# Patient Record
Sex: Female | Born: 1955 | Race: White | Hispanic: No | Smoking: Current every day smoker
Health system: Southern US, Community
[De-identification: ages and names within clinical notes are randomized; demographics above are authoritative.]

## PROBLEM LIST (undated history)

## (undated) DIAGNOSIS — B1920 Unspecified viral hepatitis C without hepatic coma: Secondary | ICD-10-CM

## (undated) DIAGNOSIS — K219 Gastro-esophageal reflux disease without esophagitis: Secondary | ICD-10-CM

## (undated) DIAGNOSIS — R768 Other specified abnormal immunological findings in serum: Secondary | ICD-10-CM

## (undated) DIAGNOSIS — Z972 Presence of dental prosthetic device (complete) (partial): Secondary | ICD-10-CM

## (undated) DIAGNOSIS — F419 Anxiety disorder, unspecified: Secondary | ICD-10-CM

## (undated) DIAGNOSIS — Z8489 Family history of other specified conditions: Secondary | ICD-10-CM

## (undated) DIAGNOSIS — Z973 Presence of spectacles and contact lenses: Secondary | ICD-10-CM

## (undated) DIAGNOSIS — J302 Other seasonal allergic rhinitis: Secondary | ICD-10-CM

## (undated) DIAGNOSIS — K08109 Complete loss of teeth, unspecified cause, unspecified class: Secondary | ICD-10-CM

## (undated) DIAGNOSIS — M1990 Unspecified osteoarthritis, unspecified site: Secondary | ICD-10-CM

## (undated) DIAGNOSIS — G709 Myoneural disorder, unspecified: Secondary | ICD-10-CM

## (undated) DIAGNOSIS — J410 Simple chronic bronchitis: Secondary | ICD-10-CM

## (undated) DIAGNOSIS — Z720 Tobacco use: Secondary | ICD-10-CM

## (undated) DIAGNOSIS — J449 Chronic obstructive pulmonary disease, unspecified: Secondary | ICD-10-CM

## (undated) DIAGNOSIS — N903 Dysplasia of vulva, unspecified: Secondary | ICD-10-CM

## (undated) DIAGNOSIS — K7460 Unspecified cirrhosis of liver: Secondary | ICD-10-CM

## (undated) HISTORY — DX: Tobacco use: Z72.0

## (undated) HISTORY — PX: TONSILLECTOMY: SUR1361

## (undated) HISTORY — DX: Myoneural disorder, unspecified: G70.9

## (undated) HISTORY — DX: Unspecified viral hepatitis C without hepatic coma: B19.20

## (undated) HISTORY — PX: SKIN GRAFT FULL THICKNESS LEG: SUR1299

## (undated) HISTORY — DX: Gastro-esophageal reflux disease without esophagitis: K21.9

## (undated) HISTORY — DX: Unspecified cirrhosis of liver: K74.60

## (undated) HISTORY — PX: UMBILICAL HERNIA REPAIR: SHX196

## (undated) HISTORY — DX: Anxiety disorder, unspecified: F41.9

## (undated) HISTORY — DX: Dysplasia of vulva, unspecified: N90.3

---

## 2011-12-03 ENCOUNTER — Emergency Department: Payer: Self-pay | Admitting: Emergency Medicine

## 2011-12-03 LAB — COMPREHENSIVE METABOLIC PANEL
Albumin: 3.6 g/dL (ref 3.4–5.0)
Anion Gap: 9 (ref 7–16)
Calcium, Total: 9.2 mg/dL (ref 8.5–10.1)
Chloride: 105 mmol/L (ref 98–107)
Co2: 26 mmol/L (ref 21–32)
EGFR (African American): >60
Glucose: 137 mg/dL — ABNORMAL HIGH (ref 65–99)
Osmolality: 281 (ref 275–301)
Potassium: 3.8 mmol/L (ref 3.5–5.1)
SGOT(AST): 37 U/L (ref 15–37)
Sodium: 140 mmol/L (ref 136–145)

## 2011-12-03 LAB — CBC
HCT: 46.3 % (ref 35.0–47.0)
MCHC: 34.5 g/dL (ref 32.0–36.0)
MCV: 88 fL (ref 80–100)
RDW: 12.5 % (ref 11.5–14.5)

## 2012-06-02 ENCOUNTER — Ambulatory Visit: Payer: Self-pay | Admitting: Family Medicine

## 2012-06-02 ENCOUNTER — Ambulatory Visit: Payer: Self-pay

## 2012-06-02 VITALS — BP 102/60 | HR 96 | Temp 100.4°F | Resp 16 | Ht 67.0 in | Wt 144.6 lb

## 2012-06-02 DIAGNOSIS — R059 Cough, unspecified: Secondary | ICD-10-CM

## 2012-06-02 DIAGNOSIS — R51 Headache: Secondary | ICD-10-CM

## 2012-06-02 DIAGNOSIS — R6883 Chills (without fever): Secondary | ICD-10-CM

## 2012-06-02 DIAGNOSIS — R05 Cough: Secondary | ICD-10-CM

## 2012-06-02 DIAGNOSIS — R112 Nausea with vomiting, unspecified: Secondary | ICD-10-CM

## 2012-06-02 DIAGNOSIS — R509 Fever, unspecified: Secondary | ICD-10-CM

## 2012-06-02 LAB — COMPREHENSIVE METABOLIC PANEL
ALT: 33 U/L (ref 0–35)
AST: 36 U/L (ref 0–37)
Albumin: 4 g/dL (ref 3.5–5.2)
Alkaline Phosphatase: 94 U/L (ref 39–117)
BUN: 15 mg/dL (ref 6–23)
CO2: 26 mEq/L (ref 19–32)
Calcium: 9.3 mg/dL (ref 8.4–10.5)
Chloride: 100 mEq/L (ref 96–112)
Creat: 1.02 mg/dL (ref 0.50–1.10)
Glucose, Bld: 100 mg/dL — ABNORMAL HIGH (ref 70–99)
Potassium: 3.8 mEq/L (ref 3.5–5.3)
Sodium: 136 mEq/L (ref 135–145)
Total Bilirubin: 0.9 mg/dL (ref 0.3–1.2)
Total Protein: 7.8 g/dL (ref 6.0–8.3)

## 2012-06-02 LAB — POCT CBC
Granulocyte percent: 57.4 %G (ref 37–80)
HCT, POC: 45.8 % (ref 37.7–47.9)
Hemoglobin: 14.9 g/dL (ref 12.2–16.2)
Lymph, poc: 2 (ref 0.6–3.4)
MCH, POC: 29.5 pg (ref 27–31.2)
MCHC: 32.5 g/dL (ref 31.8–35.4)
MCV: 90.6 fL (ref 80–97)
MID (cbc): 0.4 (ref 0–0.9)
MPV: 9.3 fL (ref 0–99.8)
POC Granulocyte: 3.2 (ref 2–6.9)
POC LYMPH PERCENT: 35.3 %L (ref 10–50)
POC MID %: 7.3 %M (ref 0–12)
Platelet Count, POC: 148 10*3/uL (ref 142–424)
RBC: 5.05 M/uL (ref 4.04–5.48)
RDW, POC: 12.8 %
WBC: 5.6 10*3/uL (ref 4.6–10.2)

## 2012-06-02 MED ORDER — PHENYLEPH-PROMETHAZINE-COD 5-6.25-10 MG/5ML PO SYRP: 5.0000 mL | ORAL_SOLUTION | Freq: Four times a day (QID) | ORAL | Status: DC | PRN

## 2012-06-02 MED ORDER — PREDNISONE 20 MG PO TABS: ORAL_TABLET | ORAL | Status: DC

## 2012-06-02 MED ORDER — LEVOFLOXACIN 500 MG PO TABS: 500.0000 mg | ORAL_TABLET | Freq: Every day | ORAL | Status: DC

## 2012-06-02 NOTE — Patient Instructions (Signed)

## 2012-06-02 NOTE — Progress Notes (Signed)
57 year old woman, smoker, retired, who presents with severe cough, nausea and vomiting, sore throat, headache, myalgias for the last week. Cough is productive  Objective: Patient looks Haggard but alert with good eye contact and pleasant demeanor HEENT: Unremarkable with exception of a reddened throat. She's got dentures Neck: Supple no adenopathy Chest: Bilateral respiratory rales without dyspnea Heart: Regular no murmur Skin: Warm and dry [facility] reading (PRIMARY) by  Dr. Milus Glazier  CXR peribronchial thickening and suggestion of early infiltrate in left hilum, flattened diaphragm Results for orders placed in visit on 06/02/12  POCT CBC      Result Value Range   WBC 5.6  4.6 - 10.2 K/uL   Lymph, poc 2.0  0.6 - 3.4   POC LYMPH PERCENT 35.3  10 - 50 %L   MID (cbc) 0.4  0 - 0.9   POC MID % 7.3  0 - 12 %M   POC Granulocyte 3.2  2 - 6.9   Granulocyte percent 57.4  37 - 80 %G   RBC 5.05  4.04 - 5.48 M/uL   Hemoglobin 14.9  12.2 - 16.2 g/dL   HCT, POC 16.1  09.6 - 47.9 %   MCV 90.6  80 - 97 fL   MCH, POC 29.5  27 - 31.2 pg   MCHC 32.5  31.8 - 35.4 g/dL   RDW, POC 04.5     Platelet Count, POC 148  142 - 424 K/uL   MPV 9.3  0 - 99.8 fL    Assessment:  Early pneumonia, COPD  Plan:Cough - Plan: DG Chest 2 View, POCT CBC, Comprehensive metabolic panel, levofloxacin (LEVAQUIN) 500 MG tablet, Phenyleph-Promethazine-Cod 5-6.25-10 MG/5ML SYRP, predniSONE (DELTASONE) 20 MG tablet  Headache - Plan: POCT CBC, levofloxacin (LEVAQUIN) 500 MG tablet  Chills - Plan: POCT CBC, levofloxacin (LEVAQUIN) 500 MG tablet  Fever - Plan: POCT CBC, levofloxacin (LEVAQUIN) 500 MG tablet  Nausea with vomiting - Plan: levofloxacin (LEVAQUIN) 500 MG tablet   .

## 2016-10-27 DIAGNOSIS — T1490XA Injury, unspecified, initial encounter: Secondary | ICD-10-CM | POA: Insufficient documentation

## 2016-10-28 DIAGNOSIS — S62111A Displaced fracture of triquetrum [cuneiform] bone, right wrist, initial encounter for closed fracture: Secondary | ICD-10-CM | POA: Insufficient documentation

## 2016-10-28 DIAGNOSIS — S92309A Fracture of unspecified metatarsal bone(s), unspecified foot, initial encounter for closed fracture: Secondary | ICD-10-CM | POA: Insufficient documentation

## 2016-10-28 DIAGNOSIS — S5291XA Unspecified fracture of right forearm, initial encounter for closed fracture: Secondary | ICD-10-CM | POA: Insufficient documentation

## 2016-10-28 DIAGNOSIS — S92251A Displaced fracture of navicular [scaphoid] of right foot, initial encounter for closed fracture: Secondary | ICD-10-CM | POA: Insufficient documentation

## 2017-01-16 DIAGNOSIS — Z945 Skin transplant status: Secondary | ICD-10-CM | POA: Insufficient documentation

## 2017-01-16 DIAGNOSIS — M79671 Pain in right foot: Secondary | ICD-10-CM | POA: Insufficient documentation
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
## 2020-04-05 ENCOUNTER — Other Ambulatory Visit: Payer: Self-pay

## 2020-04-05 ENCOUNTER — Ambulatory Visit (INDEPENDENT_AMBULATORY_CARE_PROVIDER_SITE_OTHER): Payer: 59 | Admitting: Family

## 2020-04-05 VITALS — BP 136/83 | HR 72 | Ht 66.3 in | Wt 143.2 lb

## 2020-04-05 DIAGNOSIS — N952 Postmenopausal atrophic vaginitis: Secondary | ICD-10-CM | POA: Insufficient documentation

## 2020-04-05 DIAGNOSIS — Z124 Encounter for screening for malignant neoplasm of cervix: Secondary | ICD-10-CM

## 2020-04-05 DIAGNOSIS — Z7689 Persons encountering health services in other specified circumstances: Secondary | ICD-10-CM | POA: Diagnosis not present

## 2020-04-05 DIAGNOSIS — Z716 Tobacco abuse counseling: Secondary | ICD-10-CM | POA: Diagnosis not present

## 2020-04-05 DIAGNOSIS — B009 Herpesviral infection, unspecified: Secondary | ICD-10-CM | POA: Insufficient documentation

## 2020-04-05 DIAGNOSIS — Z122 Encounter for screening for malignant neoplasm of respiratory organs: Secondary | ICD-10-CM | POA: Diagnosis not present

## 2020-04-05 DIAGNOSIS — Z23 Encounter for immunization: Secondary | ICD-10-CM

## 2020-04-05 DIAGNOSIS — N9089 Other specified noninflammatory disorders of vulva and perineum: Secondary | ICD-10-CM | POA: Insufficient documentation

## 2020-04-05 DIAGNOSIS — Z1231 Encounter for screening mammogram for malignant neoplasm of breast: Secondary | ICD-10-CM

## 2020-04-05 MED ORDER — VARENICLINE TARTRATE 0.5 MG PO TABS: ORAL_TABLET | ORAL | 0 refills | Status: DC

## 2020-04-05 NOTE — Patient Instructions (Addendum)
Return in 4 to 6 weeks or sooner if needed for annual physical examination, labs, and health maintenance. Arrive fasting meaning having had no food and/or nothing to drink for at least 8 hours prior to appointment.  Chantix for smoking cessation.  Lung cancer screening.   Referral to Gynecology.   Shingles vaccine today. Thank you for choosing Primary Care at [facility] for your medical home!    Laura Sloan was seen by Camillia Herter, NP today.   Guinevere Scarlet primary care provider is Camillia Herter, NP.   For the best care possible,  you should try to see Durene Fruits, NP whenever you come to clinic.   We look forward to seeing you again soon!  If you have any questions about your visit today,  please call us at [phone]  Or feel free to reach your provider via [place].   Steps to Quit Smoking Smoking tobacco is the leading cause of preventable death. It can affect almost every organ in the body. Smoking puts you and people around you at risk for many serious, long-lasting (chronic) diseases. Quitting smoking can be hard, but it is one of the best things that you can do for your health. It is never too late to quit. How do I get ready to quit? When you decide to quit smoking, make a plan to help you succeed. Before you quit:  Pick a date to quit. Set a date within the next 2 weeks to give you time to prepare.  Write down the reasons why you are quitting. Keep this list in places where you will see it often.  Tell your family, friends, and co-workers that you are quitting. Their support is important.  Talk with your doctor about the choices that may help you quit.  Find out if your health insurance will pay for these treatments.  Know the people, places, things, and activities that make you want to smoke (triggers). Avoid them. What first steps can I take to quit smoking?  Throw away all cigarettes at home, at work, and in your car.  Throw away the things  that you use when you smoke, such as ashtrays and lighters.  Clean your car. Make sure to empty the ashtray.  Clean your home, including curtains and carpets. What can I do to help me quit smoking? Talk with your doctor about taking medicines and seeing a counselor at the same time. You are more likely to succeed when you do both.  If you are pregnant or breastfeeding, talk with your doctor about counseling or other ways to quit smoking. Do not take medicine to help you quit smoking unless your doctor tells you to do so. To quit smoking: Quit right away  Quit smoking totally, instead of slowly cutting back on how much you smoke over a period of time.  Go to counseling. You are more likely to quit if you go to counseling sessions regularly. Take medicine You may take medicines to help you quit. Some medicines need a prescription, and some you can buy over-the-counter. Some medicines may contain a drug called nicotine to replace the nicotine in cigarettes. Medicines may:  Help you to stop having the desire to smoke (cravings).  Help to stop the problems that come when you stop smoking (withdrawal symptoms). Your doctor may ask you to use:  Nicotine patches, gum, or lozenges.  Nicotine inhalers or sprays.  Non-nicotine medicine that is taken by mouth. Find resources Find resources and  other ways to help you quit smoking and remain smoke-free after you quit. These resources are most helpful when you use them often. They include:  Online chats with a Social worker.  Phone quitlines.  Printed Furniture conservator/restorer.  Support groups or group counseling.  Text messaging programs.  Mobile phone apps. Use apps on your mobile phone or tablet that can help you stick to your quit plan. There are many free apps for mobile phones and tablets as well as websites. Examples include Quit Guide from the [employer] and smokefree.gov   What things can I do to make it easier to quit?  Talk to your family and  friends. Ask them to support and encourage you.  Call a phone quitline (1-800-QUIT-NOW), reach out to support groups, or work with a Social worker.  Ask people who smoke to not smoke around you.  Avoid places that make you want to smoke, such as: ? Bars. ? Parties. ? Smoke-break areas at work.  Spend time with people who do not smoke.  Lower the stress in your life. Stress can make you want to smoke. Try these things to help your stress: ? Getting regular exercise. ? Doing deep-breathing exercises. ? Doing yoga. ? Meditating. ? Doing a body scan. To do this, close your eyes, focus on one area of your body at a time from head to toe. Notice which parts of your body are tense. Try to relax the muscles in those areas.   How will I feel when I quit smoking? Day 1 to 3 weeks Within the first 24 hours, you may start to have some problems that come from quitting tobacco. These problems are very bad 2-3 days after you quit, but they do not often last for more than 2-3 weeks. You may get these symptoms:  Mood swings.  Feeling restless, nervous, angry, or annoyed.  Trouble concentrating.  Dizziness.  Strong desire for high-sugar foods and nicotine.  Weight gain.  Trouble pooping (constipation).  Feeling like you may vomit (nausea).  Coughing or a sore throat.  Changes in how the medicines that you take for other issues work in your body.  Depression.  Trouble sleeping (insomnia). Week 3 and afterward After the first 2-3 weeks of quitting, you may start to notice more positive results, such as:  Better sense of smell and taste.  Less coughing and sore throat.  Slower heart rate.  Lower blood pressure.  Clearer skin.  Better breathing.  Fewer sick days. Quitting smoking can be hard. Do not give up if you fail the first time. Some people need to try a few times before they succeed. Do your best to stick to your quit plan, and talk with your doctor if you have any  questions or concerns. Summary  Smoking tobacco is the leading cause of preventable death. Quitting smoking can be hard, but it is one of the best things that you can do for your health.  When you decide to quit smoking, make a plan to help you succeed.  Quit smoking right away, not slowly over a period of time.  When you start quitting, seek help from your doctor, family, or friends. This information is not intended to replace advice given to you by your health care provider. Make sure you discuss any questions you have with your health care provider. Document Revised: 11/13/2018 Document Reviewed: 05/09/2018 Elsevier Patient Education  [employer].

## 2020-04-05 NOTE — Progress Notes (Signed)
Establish care Wants help to stop smoking Pt feels like mucous is just sitting in throat  Need GYN referral  Pt desires shingles vaccine

## 2020-04-07 ENCOUNTER — Encounter (INDEPENDENT_AMBULATORY_CARE_PROVIDER_SITE_OTHER): Payer: Self-pay

## 2020-04-19 ENCOUNTER — Encounter: Payer: Self-pay | Admitting: Family

## 2020-04-20 ENCOUNTER — Telehealth: Payer: Self-pay

## 2020-04-20 ENCOUNTER — Telehealth: Payer: Self-pay | Admitting: Family

## 2020-04-20 NOTE — Telephone Encounter (Signed)
Information is needed for CT appt: TAX ID, NPI, Fax Number Please advise and thank you

## 2020-04-20 NOTE — Telephone Encounter (Signed)
Att to notify pt of scheduled CT scan no answer phone just rings. Message has been sent to pt Mychart

## 2020-04-26 ENCOUNTER — Telehealth: Payer: Self-pay

## 2020-04-26 ENCOUNTER — Telehealth: Payer: Self-pay | Admitting: Family

## 2020-04-26 NOTE — Telephone Encounter (Signed)
Contacted the office on prior authorization for CT, please advise.

## 2020-04-26 NOTE — Telephone Encounter (Signed)
Pt calling to inform PCP about CT CHEST LUNG CANCER SCREENING being rescheduled to 05/04/20 due to Lack of Insurance authorization. Pt would like to know if there's anything she need to do regarding this. Please advise and thank you

## 2020-04-27 ENCOUNTER — Telehealth: Payer: Self-pay

## 2020-04-27 ENCOUNTER — Ambulatory Visit (HOSPITAL_COMMUNITY): Payer: 59

## 2020-04-27 NOTE — Telephone Encounter (Signed)
Spoke w/ pt advised that PA pending for CT procedure

## 2020-04-27 NOTE — Telephone Encounter (Signed)
PA request refaxed pending authorization

## 2020-05-01 ENCOUNTER — Other Ambulatory Visit: Payer: Self-pay | Admitting: Family

## 2020-05-01 ENCOUNTER — Encounter: Payer: Self-pay | Admitting: Family

## 2020-05-01 DIAGNOSIS — Z716 Tobacco abuse counseling: Secondary | ICD-10-CM

## 2020-05-02 DIAGNOSIS — B182 Chronic viral hepatitis C: Secondary | ICD-10-CM

## 2020-05-02 DIAGNOSIS — K7460 Unspecified cirrhosis of liver: Secondary | ICD-10-CM

## 2020-05-02 HISTORY — DX: Unspecified cirrhosis of liver: K74.60

## 2020-05-02 HISTORY — DX: Chronic viral hepatitis C: B18.2

## 2020-05-03 ENCOUNTER — Ambulatory Visit (INDEPENDENT_AMBULATORY_CARE_PROVIDER_SITE_OTHER): Payer: 59 | Admitting: Family

## 2020-05-03 ENCOUNTER — Other Ambulatory Visit: Payer: Self-pay

## 2020-05-03 ENCOUNTER — Encounter: Payer: Self-pay | Admitting: Family

## 2020-05-03 VITALS — BP 128/85 | HR 61 | Ht 66.3 in | Wt 143.6 lb

## 2020-05-03 DIAGNOSIS — B1920 Unspecified viral hepatitis C without hepatic coma: Secondary | ICD-10-CM

## 2020-05-03 DIAGNOSIS — D539 Nutritional anemia, unspecified: Secondary | ICD-10-CM

## 2020-05-03 DIAGNOSIS — Z131 Encounter for screening for diabetes mellitus: Secondary | ICD-10-CM

## 2020-05-03 DIAGNOSIS — Z1159 Encounter for screening for other viral diseases: Secondary | ICD-10-CM

## 2020-05-03 DIAGNOSIS — Z114 Encounter for screening for human immunodeficiency virus [HIV]: Secondary | ICD-10-CM

## 2020-05-03 DIAGNOSIS — Z1211 Encounter for screening for malignant neoplasm of colon: Secondary | ICD-10-CM

## 2020-05-03 DIAGNOSIS — Z716 Tobacco abuse counseling: Secondary | ICD-10-CM

## 2020-05-03 DIAGNOSIS — Z130 Encounter for screening for diseases of the blood and blood-forming organs and certain disorders involving the immune mechanism: Secondary | ICD-10-CM

## 2020-05-03 DIAGNOSIS — K5900 Constipation, unspecified: Secondary | ICD-10-CM

## 2020-05-03 DIAGNOSIS — Z1329 Encounter for screening for other suspected endocrine disorder: Secondary | ICD-10-CM

## 2020-05-03 DIAGNOSIS — Z0000 Encounter for general adult medical examination without abnormal findings: Secondary | ICD-10-CM | POA: Diagnosis not present

## 2020-05-03 DIAGNOSIS — Z13220 Encounter for screening for lipoid disorders: Secondary | ICD-10-CM

## 2020-05-03 DIAGNOSIS — Z13228 Encounter for screening for other metabolic disorders: Secondary | ICD-10-CM

## 2020-05-03 DIAGNOSIS — G629 Polyneuropathy, unspecified: Secondary | ICD-10-CM

## 2020-05-03 DIAGNOSIS — K219 Gastro-esophageal reflux disease without esophagitis: Secondary | ICD-10-CM

## 2020-05-03 MED ORDER — OMEPRAZOLE 20 MG PO CPDR: 20.0000 mg | DELAYED_RELEASE_CAPSULE | Freq: Every day | ORAL | 0 refills | Status: DC

## 2020-05-03 MED ORDER — POLYETHYLENE GLYCOL 3350 17 G PO PACK: 17.0000 g | PACK | Freq: Every day | ORAL | 0 refills | Status: DC

## 2020-05-03 NOTE — Patient Instructions (Addendum)
Annual physical exam and labs today. Will call with results.   Miralax for constipation.  Omeprazole for acid reflux.   Referral to Neurology.   Follow-up with primary provider as needed. Preventive Care 31-63 Years Old, Female Preventive care refers to lifestyle choices and visits with your health care provider that can promote health and wellness. This includes:  A yearly physical exam. This is also called an annual wellness visit.  Regular dental and eye exams.  Immunizations.  Screening for certain conditions.  Healthy lifestyle choices, such as: ? Eating a healthy diet. ? Getting regular exercise. ? Not using drugs or products that contain nicotine and tobacco. ? Limiting alcohol use. What can I expect for my preventive care visit? Physical exam Your health care provider will check your:  Height and weight. These may be used to calculate your BMI (body mass index). BMI is a measurement that tells if you are at a healthy weight.  Heart rate and blood pressure.  Body temperature.  Skin for abnormal spots. Counseling Your health care provider may ask you questions about your:  Past medical problems.  Family's medical history.  Alcohol, tobacco, and drug use.  Emotional well-being.  Home life and relationship well-being.  Sexual activity.  Diet, exercise, and sleep habits.  Work and work Statistician.  Access to firearms.  Method of birth control.  Menstrual cycle.  Pregnancy history. What immunizations do I need? Vaccines are usually given at various ages, according to a schedule. Your health care provider will recommend vaccines for you based on your age, medical history, and lifestyle or other factors, such as travel or where you work.   What tests do I need? Blood tests  Lipid and cholesterol levels. These may be checked every 5 years, or more often if you are over 14 years old.  Hepatitis C test.  Hepatitis B test. Screening  Lung  cancer screening. You may have this screening every year starting at age 55 if you have a 30-pack-year history of smoking and currently smoke or have quit within the past 15 years.  Colorectal cancer screening. ? All adults should have this screening starting at age 50 and continuing until age 38. ? Your health care provider may recommend screening at age 67 if you are at increased risk. ? You will have tests every 1-10 years, depending on your results and the type of screening test.  Diabetes screening. ? This is done by checking your blood sugar (glucose) after you have not eaten for a while (fasting). ? You may have this done every 1-3 years.  Mammogram. ? This may be done every 1-2 years. ? Talk with your health care provider about when you should start having regular mammograms. This may depend on whether you have a family history of breast cancer.  BRCA-related cancer screening. This may be done if you have a family history of breast, ovarian, tubal, or peritoneal cancers.  Pelvic exam and Pap test. ? This may be done every 3 years starting at age 44. ? Starting at age 60, this may be done every 5 years if you have a Pap test in combination with an HPV test. Other tests  STD (sexually transmitted disease) testing, if you are at risk.  Bone density scan. This is done to screen for osteoporosis. You may have this scan if you are at high risk for osteoporosis. Talk with your health care provider about your test results, treatment options, and if necessary, the need for  more tests. Follow these instructions at home: Eating and drinking  Eat a diet that includes fresh fruits and vegetables, whole grains, lean protein, and low-fat dairy products.  Take vitamin and mineral supplements as recommended by your health care provider.  Do not drink alcohol if: ? Your health care provider tells you not to drink. ? You are pregnant, may be pregnant, or are planning to become pregnant.  If  you drink alcohol: ? Limit how much you have to 0-1 drink a day. ? Be aware of how much alcohol is in your drink. In the [place] one drink equals one 12 oz bottle of beer (355 mL), one 5 oz glass of wine (148 mL), or one 1 oz glass of hard liquor (44 mL).   Lifestyle  Take daily care of your teeth and gums. Brush your teeth every morning and night with fluoride toothpaste. Floss one time each day.  Stay active. Exercise for at least 30 minutes 5 or more days each week.  Do not use any products that contain nicotine or tobacco, such as cigarettes, e-cigarettes, and chewing tobacco. If you need help quitting, ask your health care provider.  Do not use drugs.  If you are sexually active, practice safe sex. Use a condom or other form of protection to prevent STIs (sexually transmitted infections).  If you do not wish to become pregnant, use a form of birth control. If you plan to become pregnant, see your health care provider for a prepregnancy visit.  If told by your health care provider, take low-dose aspirin daily starting at age 24.  Find healthy ways to cope with stress, such as: ? Meditation, yoga, or listening to music. ? Journaling. ? Talking to a trusted person. ? Spending time with friends and family. Safety  Always wear your seat belt while driving or riding in a vehicle.  Do not drive: ? If you have been drinking alcohol. Do not ride with someone who has been drinking. ? When you are tired or distracted. ? While texting.  Wear a helmet and other protective equipment during sports activities.  If you have firearms in your house, make sure you follow all gun safety procedures. What's next?  Visit your health care provider once a year for an annual wellness visit.  Ask your health care provider how often you should have your eyes and teeth checked.  Stay up to date on all vaccines. This information is not intended to replace advice given to you by your health care  provider. Make sure you discuss any questions you have with your health care provider. Document Revised: 11/23/2019 Document Reviewed: 10/30/2017 Elsevier Patient Education  2021 [employer].

## 2020-05-03 NOTE — Progress Notes (Unsigned)
Annual exam Pt has not had bowel movement in 29+ days  Has pressure in back and bloating/gas in stomach  Pt has burning sensation in both feet  Neuropathy itching
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
## 2020-05-04 ENCOUNTER — Ambulatory Visit (HOSPITAL_COMMUNITY)
Admission: RE | Admit: 2020-05-04 | Discharge: 2020-05-04 | Disposition: A | Discharge status: Home / Self Care | Payer: 59 | Source: Ambulatory Visit | Attending: Family | Admitting: Family

## 2020-05-04 ENCOUNTER — Other Ambulatory Visit: Payer: Self-pay | Admitting: Family

## 2020-05-04 DIAGNOSIS — Z122 Encounter for screening for malignant neoplasm of respiratory organs: Secondary | ICD-10-CM | POA: Diagnosis not present

## 2020-05-04 DIAGNOSIS — Z716 Tobacco abuse counseling: Secondary | ICD-10-CM

## 2020-05-04 NOTE — Telephone Encounter (Signed)
Pt stating Chantix on back order any alternatives?

## 2020-05-04 NOTE — Telephone Encounter (Signed)
Please call patient with update.  Since Chantix is on back order we can try Bupropion as an alternative.   Therapy should begin at least 1 week before target quit date. Target quit dates are generally in the second week of treatment.  The most common side effects are but not limited to tachycardia, sweating, nausea, vomiting, constipation, agitation, dizziness, headache, insomnia, migraine, and suicidal ideations.  Do not consume while taking this medication.   Please confirm if patient would like to try course of Bupropion. If yes, please schedule appointment while patient is on the phone to follow-up in 2 weeks or sooner if needed with provider.

## 2020-05-05 ENCOUNTER — Telehealth: Payer: Self-pay | Admitting: Family

## 2020-05-05 MED ORDER — BUPROPION HCL ER (SR) 150 MG PO TB12: ORAL_TABLET | ORAL | 0 refills | Status: DC

## 2020-05-05 NOTE — Telephone Encounter (Signed)
PT states she is calling following up on "the last My Chart msg from pcp to Inform Nurse of switching from Chantix to a new medication" (pt was unsure of the name for the new medication) and if it can be sent to the [id number], [place]  [place], [place] [address]  Phone:  [phone] Fax:  [phone]    Please advise and thank you.

## 2020-05-07 NOTE — Progress Notes (Signed)
Two benign lung nodules. Follow-up CT scan in 12 months or sooner if needed.

## 2020-05-08 ENCOUNTER — Encounter: Payer: Self-pay | Admitting: Gastroenterology

## 2020-05-09 ENCOUNTER — Encounter: Payer: Self-pay | Admitting: Family

## 2020-05-09 DIAGNOSIS — Z8619 Personal history of other infectious and parasitic diseases: Secondary | ICD-10-CM | POA: Insufficient documentation

## 2020-05-09 DIAGNOSIS — B1920 Unspecified viral hepatitis C without hepatic coma: Secondary | ICD-10-CM | POA: Insufficient documentation

## 2020-05-09 LAB — HCV RNA NAA QUALITATIVE: HCV RNA NAA QUALITATIVE: POSITIVE — AB

## 2020-05-09 LAB — COMPREHENSIVE METABOLIC PANEL
ALT: 130 IU/L — ABNORMAL HIGH (ref 0–32)
AST: 111 IU/L — ABNORMAL HIGH (ref 0–40)
Albumin/Globulin Ratio: 1.1 — ABNORMAL LOW (ref 1.2–2.2)
Albumin: 4.2 g/dL (ref 3.8–4.8)
Alkaline Phosphatase: 115 IU/L (ref 44–121)
BUN/Creatinine Ratio: 20 (ref 12–28)
BUN: 15 mg/dL (ref 8–27)
Bilirubin Total: 0.6 mg/dL (ref 0.0–1.2)
CO2: 23 mmol/L (ref 20–29)
Calcium: 9.8 mg/dL (ref 8.7–10.3)
Chloride: 102 mmol/L (ref 96–106)
Creatinine, Ser: 0.74 mg/dL (ref 0.57–1.00)
Globulin, Total: 4 g/dL (ref 1.5–4.5)
Glucose: 100 mg/dL — ABNORMAL HIGH (ref 65–99)
Potassium: 5.2 mmol/L (ref 3.5–5.2)
Sodium: 138 mmol/L (ref 134–144)
Total Protein: 8.2 g/dL (ref 6.0–8.5)
eGFR: 91 mL/min/{1.73_m2} (ref >59)

## 2020-05-09 LAB — CBC
Hematocrit: 48.4 % — ABNORMAL HIGH (ref 34.0–46.6)
Hemoglobin: 16.9 g/dL — ABNORMAL HIGH (ref 11.1–15.9)
MCH: 31.1 pg (ref 26.6–33.0)
MCHC: 34.9 g/dL (ref 31.5–35.7)
MCV: 89 fL (ref 79–97)
Platelets: 112 10*3/uL — ABNORMAL LOW (ref 150–450)
RBC: 5.43 x10E6/uL — ABNORMAL HIGH (ref 3.77–5.28)
RDW: 12 % (ref 11.7–15.4)
WBC: 5.4 10*3/uL (ref 3.4–10.8)

## 2020-05-09 LAB — TSH+T4F+T3FREE
Free T4: 1.5 ng/dL (ref 0.82–1.77)
T3, Free: 4.1 pg/mL (ref 2.0–4.4)
TSH: 1.13 u[IU]/mL (ref 0.450–4.500)

## 2020-05-09 LAB — HEMOGLOBIN A1C
Est. average glucose Bld gHb Est-mCnc: 111 mg/dL
Hgb A1c MFr Bld: 5.5 % (ref 4.8–5.6)

## 2020-05-09 LAB — LIPID PANEL
Chol/HDL Ratio: 4.3 ratio (ref 0.0–4.4)
Cholesterol, Total: 191 mg/dL (ref 100–199)
HDL: 44 mg/dL (ref >39)
LDL Chol Calc (NIH): 118 mg/dL — ABNORMAL HIGH (ref 0–99)
Triglycerides: 165 mg/dL — ABNORMAL HIGH (ref 0–149)
VLDL Cholesterol Cal: 29 mg/dL (ref 5–40)

## 2020-05-09 LAB — INTERPRETATION:

## 2020-05-09 LAB — HIV ANTIBODY (ROUTINE TESTING W REFLEX): HIV Screen 4th Generation wRfx: NONREACTIVE

## 2020-05-09 LAB — HCV AB W/RFLX TO VERIFICATION: HCV Ab: >11 s/co ratio — ABNORMAL HIGH (ref 0.0–0.9)

## 2020-05-09 NOTE — Addendum Note (Signed)
Addended by: Camillia Herter on: 05/09/2020 10:58 PM   Modules accepted: Orders

## 2020-05-09 NOTE — Progress Notes (Signed)
Kidney function normal.   Thyroid normal.   No diabetes.   HIV negative.   Triglycerides and LDL cholesterol, sometimes called "bad cholesterol", higher than expected. High cholesterol may increase risk of heart attack and/or stroke. Consider eating more fruits, vegetables, and lean baked meats such as chicken or fish. Moderate intensity exercise at least 150 minutes as tolerated per week may help as well. Patient encouraged to schedule appointment to have cholesterol rechecked within 3 to 6 months.  Please schedule lab only appointment to check for possible vitamin D deficiency and vitamin B12 deficiency related to high hemoglobin and hematocrit.   Hepatitis C positive. AST/ALT are both elevated, these are used to check liver function, and likely related to Hepatitis C. Platelets are low which are also likely related to Hepatitis C. Referral to Hepatology (liver doctor) and Infectious Disease.    Please report to the Emergency Department or Urgent Care if symptoms don't improve, worsen, or new problems develop.

## 2020-05-10 ENCOUNTER — Telehealth: Payer: Self-pay | Admitting: Family

## 2020-05-10 NOTE — Telephone Encounter (Signed)
Pt called checking status of return call for results.

## 2020-05-10 NOTE — Telephone Encounter (Signed)
PT Would like a call* Pt called and asked to speak with PCP or Nurse URGENTLY  Regarding her results. Pt was told PCP and Nurse are in clinic at the moment but would be notified asap. Pt states she is "freaking out!" Please advise and thank you

## 2020-05-10 NOTE — Telephone Encounter (Signed)
Pt is requesting a call from PCP regarding a  problem with Insurance and a referral made. Pt requested a call back.

## 2020-05-10 NOTE — Telephone Encounter (Signed)
Pt was called in regards to lab results

## 2020-05-11 ENCOUNTER — Other Ambulatory Visit (HOSPITAL_COMMUNITY)
Admission: RE | Admit: 2020-05-11 | Discharge: 2020-05-11 | Disposition: A | Discharge status: Home / Self Care | Payer: 59 | Source: Ambulatory Visit | Attending: Obstetrics and Gynecology | Admitting: Obstetrics and Gynecology

## 2020-05-11 ENCOUNTER — Other Ambulatory Visit: Payer: Self-pay

## 2020-05-11 ENCOUNTER — Ambulatory Visit (INDEPENDENT_AMBULATORY_CARE_PROVIDER_SITE_OTHER): Payer: 59 | Admitting: Obstetrics and Gynecology

## 2020-05-11 ENCOUNTER — Encounter: Payer: Self-pay | Admitting: Obstetrics and Gynecology

## 2020-05-11 VITALS — BP 137/85 | HR 66 | Ht 66.75 in | Wt 143.9 lb

## 2020-05-11 DIAGNOSIS — Z124 Encounter for screening for malignant neoplasm of cervix: Secondary | ICD-10-CM | POA: Insufficient documentation

## 2020-05-11 DIAGNOSIS — Z01419 Encounter for gynecological examination (general) (routine) without abnormal findings: Secondary | ICD-10-CM | POA: Diagnosis not present

## 2020-05-11 DIAGNOSIS — L292 Pruritus vulvae: Secondary | ICD-10-CM | POA: Diagnosis not present

## 2020-05-11 DIAGNOSIS — N9089 Other specified noninflammatory disorders of vulva and perineum: Secondary | ICD-10-CM

## 2020-05-11 DIAGNOSIS — Z1231 Encounter for screening mammogram for malignant neoplasm of breast: Secondary | ICD-10-CM

## 2020-05-11 DIAGNOSIS — N952 Postmenopausal atrophic vaginitis: Secondary | ICD-10-CM
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
## 2020-05-11 NOTE — Progress Notes (Signed)
Pt presents today as new patient for yearly exam and to establish care. Last pap: 04/2014. LMP/BC: Postmenopausal. Pt has questions about getting lab work for Vitamin D/Vitamin B12. Also has questions regarding lab work done yesterday and a positive hep C level. Pt c/o vaginal d/c with severe itching, some burning, and slight odor. Pt states that this has been a recurrent issue. Pt states she is scheduled for consult for colonoscopy on 4/27 and procedure 5/11.

## 2020-05-12 ENCOUNTER — Ambulatory Visit (INDEPENDENT_AMBULATORY_CARE_PROVIDER_SITE_OTHER): Payer: 59 | Admitting: Infectious Diseases

## 2020-05-12 ENCOUNTER — Encounter: Payer: Self-pay | Admitting: Infectious Diseases

## 2020-05-12 VITALS — BP 122/87 | HR 79 | Temp 97.8°F | Resp 16 | Ht 66.6 in | Wt 147.3 lb

## 2020-05-12 DIAGNOSIS — B182 Chronic viral hepatitis C: Secondary | ICD-10-CM

## 2020-05-12 LAB — CERVICOVAGINAL ANCILLARY ONLY
Bacterial Vaginitis (gardnerella): NEGATIVE
Candida Glabrata: NEGATIVE
Candida Vaginitis: NEGATIVE
Chlamydia: NEGATIVE
Comment: NEGATIVE
Comment: NEGATIVE
Comment: NEGATIVE
Comment: NEGATIVE
Comment: NEGATIVE
Comment: NORMAL
Neisseria Gonorrhea: NEGATIVE
Trichomonas: NEGATIVE

## 2020-05-12 LAB — CYTOLOGY - PAP
Comment: NEGATIVE
Diagnosis: NEGATIVE
High risk HPV: NEGATIVE

## 2020-05-12 NOTE — Progress Notes (Signed)
[facility] for Infectious Diseases                                      [address], [place], [place], [address]                                               Phn. [phone]; Fax: [phone]                                                               Date: 05/12/20 Reason for Visit: Hepatitis C    HPI: Laura Sloan is a 65 y.o.old female with PMH of Anxiety, GERD who is referred by PCP after being tested positive for hepatitis C during screening on May 03, 2020.  She has a history of IVDU back in 1980s and used IV drugs for approximately 2 to 3 years.  However she denies sharing needles.  She says she has been clean since 1987.  Denies any previous history of blood transfusion, sharing of toothbrushes or razors.  He is heterosexual and denies any known sexual partners with hepatitis C.  Denies any liver disease in herself or in her family.  She has not received treatment for hepatitis C yet and willing to be treated.   She has smoked for 49 years.  She used to smoke 1 pack a day and is now down to half pack a week.  She is using medicine to help quit tobacco use.  She drinks alcohol very rarely 4 drinks a month.  PMH- Neuropathy PSH-knee rt skin graft  Allergies - pollen, sinus  Social -lives with herself and her soon to be husband.  She has a 25 year daughter.  She is retired. She has a dog who just died.   Denies any acute complaints today.   ROS: Denies yellowish discoloration of sclera and skin, abdominal pain/distension, hematemesis.            Dcough, fever, chills, nightsweats, nausea, vomiting, diarrhea, constipation, weight loss, recent hospitalizations, rashes, joint complaints, shortness of breath, chest pain, headaches, dysuria .  Past Medical History:  Diagnosis Date  . Anxiety   . GERD (gastroesophageal reflux disease)    Phreesia 05/02/2020  . Neuromuscular disorder [facility])    Phreesia 04/05/2020    Past  Surgical History:  Procedure Laterality Date  . COSMETIC SURGERY N/A    Phreesia 04/05/2020   Current Outpatient Medications on File Prior to Visit  Medication Sig Dispense Refill  . buPROPion (WELLBUTRIN SR) 150 MG 12 hr tablet Take 1 tablet (150 mg total) by mouth daily for 3 days, THEN 1 tablet (150 mg total) 2 (two) times daily for 27 days. 57 tablet 0  . omeprazole (PRILOSEC) 20 MG capsule Take 1 capsule (20 mg total) by mouth daily. 90 capsule 0  . polyethylene glycol (MIRALAX) 17 g packet Take 17 g by mouth daily. 14 each 0   No current facility-administered medications on file prior to visit.    No Known Allergies  Social History   Socioeconomic History  . Marital  status: Divorced    Spouse name: Not on file  . Number of children: Not on file  . Years of education: Not on file  . Highest education level: Not on file  Occupational History  . Not on file  Tobacco Use  . Smoking status: Current Every Day Smoker    Packs/day: 1.00    Years: 49.00    Pack years: 49.00    Types: Cigarettes    Start date: 05/13/1971  . Smokeless tobacco: Never Used  . Tobacco comment: on Bupropion  Vaping Use  . Vaping Use: Former  Substance and Sexual Activity  . Alcohol use: Yes    Alcohol/week: 1.0 standard drink    Types: 1 Standard drinks or equivalent per week  . Drug use: Not Currently    Types: Cocaine    Comment: did this in the 80s.   . Sexual activity: Not Currently    Birth control/protection: Post-menopausal    Comment: Patient in relationship but no sexual activity in 8 years.  Other Topics Concern  . Not on file  Social History Narrative  . Not on file   Social Determinants of Health   Financial Resource Strain: Not on file  Food Insecurity: Not on file  Transportation Needs: Not on file  Physical Activity: Not on file  Stress: Not on file  Social Connections: Not on file  Intimate Partner Violence: Not on file    Vitals  Physical exam: BP 122/87    Pulse 79   Temp 97.8 F (36.6 C) (Temporal)   Resp 16   Ht 5' 6.6" (1.692 m)   Wt 147 lb 4.8 oz (66.8 kg)   LMP  (LMP Unknown)   SpO2 96%   BMI 23.35 kg/m   Gen: Alert and oriented x 3, no acute distress HEENT: [place]/AT, PERL, no scleral icterus, no pale conjunctivae, hearing normal, oral mucosa moist Neck: Supple, no lymphadenopathy Cardio: Regular rate and rhythm; +S1 and S2; no murmurs, gallops, or rubs Resp: CTAB; no wheezes, rhonchi, or rales GI: Soft, nontender, nondistended, bowel sounds present GU: Musc: Extremities: No cyanosis, clubbing, or edema; +2 PT and DP pulses Skin: No rashes, lesions, or ecchymoses Neuro: No focal deficits Psych: Calm, cooperative  Assessment/Plan: Hepatitis C Orders Placed This Encounter  Procedures  . [place] ELASTOGRAPHY LIVER  . Hepatitis B surface antibody,qualitative  . Hepatitis B surface antigen  . Hepatitis B core antibody, total  . Hepatitis A antibody, total  . Hepatitis C genotype  . Liver Fibrosis, FibroTest-ActiTest  . Hepatitis C RNA quantitative  . Protime-INR  . Hepatic function panel   Will fu in 4 weeks for discussing lab results and plan for tx   Smoking Encouraged on her efforts to quit smoking  Alcohol  Counseled on avoiding  Counseling done on the following -Natural progression of hep c, transmission (avoid sharing personal hygiene equipment), prevention, risks of left untreated and treatment options  -Avoid hepatotoxins like alcohol and excessive acetamaminphen (no more than 2 gram a day) -Avoid eating raw sea food -Risks of re-infection  -Hepatitis coinfection and vaccination( Pneumococcal vaccination in the cirrhotics    I spent  60 minutes with the patient including review of prior medical records with greater than 50% of time in face to face counsel of the patient.    Patient's labs were reviewed as well as his previous records. Patients questions were addressed and answered.   Electronically signed  by:  Rosiland Oz, MD Infectious Diseases  Office phone  [phone] Fax no. [phone]

## 2020-05-15 ENCOUNTER — Other Ambulatory Visit: Payer: Self-pay | Admitting: Infectious Diseases

## 2020-05-15 DIAGNOSIS — B182 Chronic viral hepatitis C: Secondary | ICD-10-CM

## 2020-05-18 LAB — HEPATIC FUNCTION PANEL
AG Ratio: 1 (calc) (ref 1.0–2.5)
ALT: 108 U/L — ABNORMAL HIGH (ref 6–29)
AST: 90 U/L — ABNORMAL HIGH (ref 10–35)
Albumin: 4.1 g/dL (ref 3.6–5.1)
Alkaline phosphatase (APISO): 103 U/L (ref 37–153)
Bilirubin, Direct: 0.2 mg/dL (ref 0.0–0.2)
Globulin: 4.1 g/dL (calc) — ABNORMAL HIGH (ref 1.9–3.7)
Indirect Bilirubin: 0.4 mg/dL (calc) (ref 0.2–1.2)
Total Bilirubin: 0.6 mg/dL (ref 0.2–1.2)
Total Protein: 8.2 g/dL — ABNORMAL HIGH (ref 6.1–8.1)

## 2020-05-18 LAB — LIVER FIBROSIS, FIBROTEST-ACTITEST
ALT: 112 U/L — ABNORMAL HIGH (ref 6–29)
Alpha-2-Macroglobulin: 397 mg/dL — ABNORMAL HIGH (ref 106–279)
Apolipoprotein A1: 150 mg/dL (ref 101–198)
Bilirubin: 0.6 mg/dL (ref 0.2–1.2)
Fibrosis Score: 0.89
GGT: 106 U/L — ABNORMAL HIGH (ref 3–65)
Haptoglobin: 14 mg/dL — ABNORMAL LOW (ref 43–212)
Necroinflammat ACT Score: 0.8
Reference ID: 3778513

## 2020-05-18 LAB — HEPATITIS B SURFACE ANTIBODY,QUALITATIVE: Hep B S Ab: NONREACTIVE

## 2020-05-18 LAB — HEPATITIS C RNA QUANTITATIVE
HCV Quantitative Log: 6.7 log IU/mL — ABNORMAL HIGH
HCV RNA, PCR, QN: 5020000 IU/mL — ABNORMAL HIGH

## 2020-05-18 LAB — PROTIME-INR
INR: 1.1
Prothrombin Time: 11.2 s (ref 9.0–11.5)

## 2020-05-18 LAB — HEPATITIS C GENOTYPE

## 2020-05-18 LAB — HEPATITIS B SURFACE ANTIGEN: Hepatitis B Surface Ag: NONREACTIVE

## 2020-05-18 LAB — HEPATITIS B CORE ANTIBODY, TOTAL: Hep B Core Total Ab: REACTIVE — AB

## 2020-05-18 LAB — HEPATITIS A ANTIBODY, TOTAL: Hepatitis A AB,Total: NONREACTIVE

## 2020-05-19 ENCOUNTER — Ambulatory Visit (HOSPITAL_COMMUNITY)
Admission: RE | Admit: 2020-05-19 | Discharge: 2020-05-19 | Disposition: A | Discharge status: Home / Self Care | Payer: 59 | Source: Ambulatory Visit | Attending: Infectious Diseases | Admitting: Infectious Diseases

## 2020-05-19 ENCOUNTER — Other Ambulatory Visit: Payer: Self-pay

## 2020-05-19 DIAGNOSIS — B182 Chronic viral hepatitis C: Secondary | ICD-10-CM | POA: Insufficient documentation

## 2020-05-22 ENCOUNTER — Telehealth: Payer: Self-pay

## 2020-05-22 NOTE — Telephone Encounter (Signed)
[facility] Patient Advocate Encounter   Received notification from [employer] that prior authorization for Laura Sloan is required.   PA submitted on 05/22/20 EOC ID [id number] Status is pending    [facility] will continue to follow.   Ileene Patrick, [facility] Patient [facility] for Infectious Disease Phone: [phone] Fax:  [phone]

## 2020-05-22 NOTE — Telephone Encounter (Signed)
[facility] Patient Advocate Encounter  Prior Authorization for Laura Sloan has been approved.    PA# [id number] Effective dates: 05/22/20 through 11/06/20  Patient script will need to be filled through [facility] [phone]. Once script is sent I will check on copays and then I will reach out to the patient to make a follow up appointment and get a start date.  [facility] will continue to follow.  Ileene Patrick, [facility] Patient [facility] for Infectious Disease Phone: [phone] Fax:  [phone]

## 2020-05-27 ENCOUNTER — Other Ambulatory Visit: Payer: Self-pay | Admitting: Family

## 2020-05-27 DIAGNOSIS — Z716 Tobacco abuse counseling: Secondary | ICD-10-CM

## 2020-05-29 NOTE — Telephone Encounter (Signed)
Please advise on anxiety refill if appropriate to continue.

## 2020-05-30 NOTE — Telephone Encounter (Signed)
Per patient request Bupropion refilled for smoking cessation.

## 2020-06-12 ENCOUNTER — Telehealth: Payer: Self-pay

## 2020-06-12 DIAGNOSIS — R768 Other specified abnormal immunological findings in serum: Secondary | ICD-10-CM | POA: Insufficient documentation

## 2020-06-12 NOTE — Assessment & Plan Note (Signed)
Positive hep b core Ab in the setting of negative Hep B surface antibody. Hard to determine if false positive core vs cleared hepatitis b infection. Will proceed with vaccination with first dose today.  Will need to monitor ALT monthly x 2 with reflex Hep B DNA, Hep B sAg if any increase noted while on Hepatitis C treatment. Likelihood of hepatitis B flare is overall very low risk.

## 2020-06-12 NOTE — Progress Notes (Signed)
Patient Name: Laura Sloan  Date of Birth: 03-20-1955  MRN: [id number]  PCP: Camillia Herter, NP  Referring Provider: Camillia Herter, NP, Ph#: [phone]   CC:  Hepatitis C follow up     HPI:  Laura Sloan is a 65 y.o. female with chronic hepatitis C infection due to genotype 1a. Fibrotest and Elastography indicate cirrhosis.   She saw Dr. West Bali in March and is still waiting on her medications and frustrated as to when she can start. She has quite a few things going on medically and wants to prioritize her health.   Her main complaint today is pruritis without rash and some numbness/tingling in feet.    Review of Systems  Constitutional: Negative for appetite change, fatigue, fever and unexpected weight change.  Respiratory: Negative for shortness of breath.   Cardiovascular: Negative for chest pain and leg swelling.  Gastrointestinal: Negative for abdominal pain, blood in stool, nausea and vomiting.  Genitourinary: Negative for difficulty urinating and hematuria.  Musculoskeletal: Negative for arthralgias.  Skin: Negative for color change.       Pruritis   Neurological: Positive for numbness. Negative for dizziness, tremors and headaches.  Hematological: Negative for adenopathy.  Psychiatric/Behavioral: Negative for confusion.        Past Medical History:  Diagnosis Date  . Anxiety   . Arthritis   . 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 ([place]) 05/2020   followed by ID, Janene Madeira NP,  dx 03/ 2022,  hx IV drug use 1980s  . Cirrhosis of liver ([place]) 05/2020  . Family history of adverse reaction to anesthesia    mother-- hard to wake  . Full dentures   . GERD (gastroesophageal reflux disease)   . Hepatitis B core antibody positive   . Seasonal allergies   . Smokers' cough ([place])   . Vulvar dysplasia   . Wears glasses     Prior to Admission medications   Medication Sig Start Date End Date Taking? Authorizing Provider  buPROPion  (WELLBUTRIN SR) 150 MG 12 hr tablet Take 1 tablet (150 mg total) by mouth daily for 3 days, THEN 1 tablet (150 mg total) 2 (two) times daily for 27 days. 05/05/20 06/04/20  Camillia Herter, NP  omeprazole (PRILOSEC) 20 MG capsule Take 1 capsule (20 mg total) by mouth daily. 05/03/20   Camillia Herter, NP  polyethylene glycol (MIRALAX) 17 g packet Take 17 g by mouth daily. 05/03/20   Camillia Herter, NP    No Known Allergies  Social History   Tobacco Use  . Smoking status: Current Every Day Smoker    Years: 49.00    Types: Cigarettes    Start date: 05/13/1971  . Smokeless tobacco: Never Used  . Tobacco comment: 07-13-2020  pt taking chantix, currently 2ppwk down from 7ppwk  Vaping Use  . Vaping Use: Former  . Quit date: 07/14/2015  Substance Use Topics  . Alcohol use: Not Currently    Comment: due to the medication for liver disease  . Drug use: Not Currently    Types: Cocaine    Comment: 07-13-2020  pt stated last drug use since  11-12-2005;  hx IV drug use 1980s      Objective:   Vitals:   06/13/20 1121  BP: 126/78  Pulse: 62   Constitutional: in no apparent distress and well developed and well nourished Eyes: anicteric Cardiovascular: Cor RRR and No murmurs Respiratory: clear Gastrointestinal: Bowel sounds are normal, liver is not enlarged,  spleen is not enlarged Musculoskeletal: peripheral pulses normal, no pedal edema, no clubbing or cyanosis Skin: negative for - jaundice, spider hemangioma, telangiectasia, palmar erythema, ecchymosis and atrophy; no porphyria cutanea tarda Lymphatic: no cervical lymphadenopathy   Laboratory: Genotype:  Lab Results  Component Value Date   HCVGENOTYPE 1a 05/12/2020   HCV viral load: No results found for: HCVQUANT Lab Results  Component Value Date   WBC 5.4 05/03/2020   HGB 16.9 (H) 05/03/2020   HCT 48.4 (H) 05/03/2020   MCV 89 05/03/2020   PLT 112 (L) 05/03/2020    Lab Results  Component Value Date   CREATININE 0.74 05/03/2020    BUN 15 05/03/2020   NA 138 05/03/2020   K 5.2 05/03/2020   CL 102 05/03/2020   CO2 23 05/03/2020    Lab Results  Component Value Date   ALT 112 (H) 05/12/2020   ALT 108 (H) 05/12/2020   AST 90 (H) 05/12/2020   GGT 106 (H) 05/12/2020   ALKPHOS 115 05/03/2020    Lab Results  Component Value Date   INR 1.1 05/12/2020   BILITOT 0.6 05/12/2020   ALBUMIN 4.2 05/03/2020     Imaging:  05-2020:  The liver has a heterogeneous and nodular contour consistent with cirrhosis. No focal liver abnormality. Elastography highly suggestive of compensated liver disease.     Assessment & Plan:   Problem List Items Addressed This Visit      Unprioritized   Pruritus    Pruritis w/o rash. Trial of hydroxyzine for PRN use. Unclear if related to hep c or other condition (allergies?)  Stay hydrated, tepid baths, moisturizers.       Hepatitis C - Primary    Genotype 1a, treatment naive patient with chronic hepatitis C infection.  Liver staging reveals elastography consistent with cirrhosis. This is consistent with serologic FibroTest indicating F4. Appears to be Child Pugh A and compensated disease.  Plan was to treat with Epclusa x 12 weeks.  Counseling discussed and provided today.  Reviewed all diagnostics and answered questions today.  Heplisav #1 today.       Relevant Medications   Sofosbuvir-Velpatasvir (EPCLUSA) 400-100 MG TABS   Hepatitis B core antibody positive    Positive hep b core Ab in the setting of negative Hep B surface antibody. Hard to determine if false positive core vs cleared hepatitis b infection. Will proceed with vaccination with first dose today.  Will need to monitor ALT monthly x 2 with reflex Hep B DNA, Hep B sAg if any increase noted while on Hepatitis C treatment. Likelihood of hepatitis B flare is overall very low risk.       Relevant Orders   Heplisav-B (HepB-CPG) Vaccine (Completed)   At risk for adverse drug interaction    Counseled re: drug  interaction risk with omeprazole = to take no more than 20 mg once daily MAX with Epclusa; preferably 4 hours AFTER the Epclusa dose. Would prefer to try to take this as little as possible.        Other Visit Diagnoses    Itching          I spent 30 minutes with the patient.   Janene Madeira, MSN, NP-C [facility] for Infectious Disease [email or web address] Pager: [phone] Office: [phone] [place]: [phone]

## 2020-06-12 NOTE — Assessment & Plan Note (Addendum)
Genotype 1a, treatment naive patient with chronic hepatitis C infection.  Liver staging reveals elastography consistent with cirrhosis. This is consistent with serologic FibroTest indicating F4. Appears to be Child Pugh A and compensated disease.  Plan was to treat with Epclusa x 12 weeks.  Counseling discussed and provided today.  Reviewed all diagnostics and answered questions today.  Heplisav #1 today.

## 2020-06-12 NOTE — Patient Instructions (Addendum)
Nice to meet you today!   We gave you your first dose of hepatitis B vaccine today - you will need a second dose in 2 months  Omeprazole this is OK to take no more than 20 mg once daily MAX with Epclusa; preferably 4 hours AFTER the Epclusa dose. Would prefer to try to take this as little as possible.   For headaches - OK to take ONE extra strength tylenol twice a day (so 1,000 mg MAX per 24 hours).   If you have fatigue consider taking your Epclusa at night.   No interactions with Epclusa and Chantix at all so OK to take both together.   Hydroxyzine is the medication I will have you try for itch relief.   Neuropathy can be related to hepatitis C, but it also can be do to other things. I think it's OK if  you see how this improves on hep c treatment and post pone the neurology appointment if you prefer.    Please come back in 1 month so we can monitor your blood work and make sure that your liver function tests are decreasing on treatment.      ABOUT HEPATITIS C VIRUS:   Chronic Hepatitis C is the most common blood-borne infection in the [place], affecting approximately 3 million people.   It is the leading cause of cirrhosis, liver cancer, and end stage liver disease requiring transplantation when this infection goes untreated for many years   The majority of people who are infected are unaware because there are not many early symptoms that are specific to this and often go undiagnosed until a specific blood test is drawn.    The hepatitis c virus is passed primarily through direct exposure of contaminated blood or body fluids. It is most efficiently transmitted through repeated exposure to infected blood.   Risk for sexual transmission is very low but is possible if there is high frequency of unprotected sexual activity with known hepatitis c partner or multiple partners of known status.   Over time, approximately 60-70% of people can develop some degree of liver  disease. Cirrhosis occurs in 10-20% of those with chronic infection. 1-5% will get liver cancer, which has a very high rate of death.    Approximately 15-25% clear the infection without medication (usually in the first 6 months of becoming exposed to virus)   Newer medications provide over 95% cure rate when taken as prescribed    IN GENERAL ABOUT DIET  . Persons living with chronic hepatitis c infection should eat a diet to maintain a healthy weight and avoid nutritional deficiencies.   . Completely avoiding alcohol is the best decision for your liver health. If unable to do so please limit alcohol to as little as possible to less than 1 standard drink a day - this is very irritating to your liver.  . Limit tylenol use to less than 2,000 mg daily (two extra strength tablets only twice a day)  . If you have cirrhosis of the liver please take no more than 1,000 mg tylenol a day  . Patients with cirrhosis should not have protein restriction; we recommend a protein intake of approximately 1.2-1.5 g/kg/day.   . For patients with cirrhosis and hepatic encephalopathy, the American Association for the Study of Liver Diseases (AASLD) recommended protein intake is 1.2-1.5 g/kg/day.  . If you experience ascites (fluid accumulation in the abdomen associated with severe liver damage / cirrhosis) please limit sodium intake to < 2000  mg a day    UNTIL YOU HAVE BEEN TREATED AND CURED:  . Use condoms with all sexual encounters or practice abstinence to avoid sexual transmission   . No sharing of razors, toothbrushes, nail clippers or anything that could potentially have blood on it.   . If you cut yourself please clean and cover any wounds or open sores to others do not come into contact with your blood.   . If blood spills onto item/surface please clean with 1:10 bleach solution and allow to dry, EVEN if it is dried blood.    GENERAL HELPFUL HINTS ON HCV THERAPY:  1. Stay  well-hydrated.  2. Notify the [facility] Clinic of any changes in your other over-the-counter/herbal or prescription medications.  3. If you miss a dose of your medication, take the missed dose as soon as you remember. Return to your regular time/dose schedule the next day.   4.  Do not stop taking your medications without first talking with your healthcare provider.  5.  You will see our pharmacist-specialist within the first 2 weeks of starting your medication to monitor for any possible side effects.  6.  You will have blood work once during treatment 4 weeks after your first pill. Again soon after treatment is completed and one final lab 3 months after your last pill to ensure cure!   TIPS TO BE SUCCESSFUL WITH DAILY MEDICATION USE:  1. Set a reminder on your phone  2. Try filling out a pill box for the week - pick a day and put one pill for every day during the week so you know right away if you missed a pill.   3. Have a trusted family member ask you about your medications.   4. Smartphone app     [name] Instructions:  1. Take Epclusa, one tablet daily with or without food. You should take it at approximately the same time every day. Your treatment will be for 12 weeks. Do not miss a dose.    2. Do not run out of Epclusa! If you are down to one week of medication left and have not heard about your next shipment, please let us know as soon as possible. You will be given 28 days of medication at a time and provided with 2 refills.   3. If you need to start a new medication, prescription from your doctor or over the counter medication, you need to contact us to make sure it does not interfere with Epclusa. There are several medications that can interfere with Epclusa and can make you sick or make the medication not work.  4. If you need to take a medication for acid reflux, your choices are as follows: 1. TUMS or Rolaids separated at least four hours from [place].  2. Pepcid  (famotidine) 40mg  up to twice per day administered with Epclusa or 12 hours apart from [place].    5. Tylenol (acetominophen) and Advil (ibuprofen) are safe to take with Epclusa if needed for headache, fever, pain.   6. DO NOT stop Epclusa unless instructed to by your provider. If you are hospitalized while taking this medication please bring it with you to the hospital to avoid interruption of therapy. Every pill is important!  7. The most common side effects associated with Epclusa  include:  o Fatigue o Headache o Nausea o Diarrhea o Insomnia

## 2020-06-12 NOTE — Telephone Encounter (Signed)
Patient previously seen by Dr. West Bali but due to insurance coverage has been switched to [facility]. Plan prior to switch was to start Epclusa after follow up appointment. Patient called to determine if this is still the plan. Informed patient that a PA was completed for the medication so once a script is written, the patient would be able to start the meds. This will be discussed at her appointment with [facility].   Notifying provider that she will be seeing her for the first time tomorrow.   Dereon Williamsen Lorita Officer, RN

## 2020-06-13 ENCOUNTER — Other Ambulatory Visit: Payer: Self-pay

## 2020-06-13 ENCOUNTER — Other Ambulatory Visit (HOSPITAL_COMMUNITY): Payer: Self-pay

## 2020-06-13 ENCOUNTER — Ambulatory Visit: Payer: 59 | Admitting: Infectious Diseases

## 2020-06-13 ENCOUNTER — Encounter: Payer: Self-pay | Admitting: Infectious Diseases

## 2020-06-13 ENCOUNTER — Telehealth: Payer: Self-pay | Admitting: Family

## 2020-06-13 ENCOUNTER — Ambulatory Visit (INDEPENDENT_AMBULATORY_CARE_PROVIDER_SITE_OTHER): Payer: 59 | Admitting: Infectious Diseases

## 2020-06-13 VITALS — BP 126/78 | HR 62 | Wt 147.0 lb

## 2020-06-13 DIAGNOSIS — L299 Pruritus, unspecified: Secondary | ICD-10-CM | POA: Diagnosis not present

## 2020-06-13 DIAGNOSIS — Z9189 Other specified personal risk factors, not elsewhere classified: Secondary | ICD-10-CM

## 2020-06-13 DIAGNOSIS — B1920 Unspecified viral hepatitis C without hepatic coma: Secondary | ICD-10-CM | POA: Diagnosis not present

## 2020-06-13 DIAGNOSIS — R768 Other specified abnormal immunological findings in serum: Secondary | ICD-10-CM

## 2020-06-13 MED ORDER — HYDROXYZINE HCL 25 MG PO TABS: 25.0000 mg | ORAL_TABLET | Freq: Three times a day (TID) | ORAL | 1 refills | Status: DC | PRN

## 2020-06-13 MED ORDER — SOFOSBUVIR-VELPATASVIR 400-100 MG PO TABS: 1.0000 | ORAL_TABLET | Freq: Every day | ORAL | 2 refills | Status: DC

## 2020-06-13 NOTE — Telephone Encounter (Signed)
Wants to get back on Chantix. (generic) due to availability.  Pharmacy said it is now available. Pt would appreciate having this asap.  Pt states she was told by [facility], it " just needs to be approved". Please advise and thank you.    [id number] [address], [place]  [place], [place] [address]  Phone:  [phone] Fax:  [phone]

## 2020-06-14 ENCOUNTER — Other Ambulatory Visit: Payer: Self-pay | Admitting: Family

## 2020-06-15 ENCOUNTER — Other Ambulatory Visit (HOSPITAL_COMMUNITY)
Admission: RE | Admit: 2020-06-15 | Discharge: 2020-06-15 | Disposition: A | Discharge status: Home / Self Care | Payer: 59 | Source: Ambulatory Visit | Attending: Obstetrics and Gynecology | Admitting: Obstetrics and Gynecology

## 2020-06-15 ENCOUNTER — Other Ambulatory Visit: Payer: Self-pay

## 2020-06-15 ENCOUNTER — Ambulatory Visit (INDEPENDENT_AMBULATORY_CARE_PROVIDER_SITE_OTHER): Payer: 59 | Admitting: Obstetrics and Gynecology

## 2020-06-15 ENCOUNTER — Encounter: Payer: Self-pay | Admitting: Obstetrics and Gynecology

## 2020-06-15 VITALS — BP 128/85 | HR 73 | Wt 148.0 lb

## 2020-06-15 DIAGNOSIS — N9089 Other specified noninflammatory disorders of vulva and perineum: Secondary | ICD-10-CM

## 2020-06-15 DIAGNOSIS — N901 Moderate vulvar dysplasia: Secondary | ICD-10-CM

## 2020-06-15 MED ORDER — VARENICLINE TARTRATE 1 MG PO TABS: 1.0000 mg | ORAL_TABLET | Freq: Every day | ORAL | 1 refills | Status: AC

## 2020-06-15 NOTE — Addendum Note (Signed)
Addended by: Camillia Herter on: 06/15/2020 12:54 PM   Modules accepted: Orders

## 2020-06-15 NOTE — Telephone Encounter (Signed)
Bupropion (Wellbutrin) discontinued.   Varenicline (Chantix) prescribed per patient request.   Please schedule follow-up appointment in 4 weeks or sooner if needed for additional refills.

## 2020-06-15 NOTE — Progress Notes (Signed)
   VULVAR BIOPSY NOTE  Shelaine Frie [id number]  06/15/2020  Indication for biopsy: bluish vulvar lesion on inner labia minora near clitoris on left, several lesions on inner labia majora just left of clitoris white/bluish appearing  The indications for vulvar biopsy were reviewed. Risks of the biopsy including pain, bleeding, infection, inadequate specimen, and need for additional procedures were discussed. The patient stated understanding and agreed to undergo procedure today. Consent was signed, and an adequate time out performed.   The patient's vulva was prepped with alcohol. Hurricane spray was used on area. 1% lidocaine was injected into left labia minora and majora. A 4-mm punch biopsy was done at inner labia majora and inner labia minora, biopsy tissue was picked up with sterile forceps and sterile scissors were used to excise the lesion.  Small bleeding was noted and hemostasis was achieved using silver nitrate sticks.    The patient tolerated the procedure well.   Specimens:  1. Inner labia majora 2. Inner labia minora  Post-procedure instructions were given to the patient. The patient is to call with heavy bleeding, fever greater than 100.4, foul smelling vaginal discharge or other concerns. The patient will be return to clinic in two weeks for discussion of results.   Feliz Beam, M.D. Attending [place], [facility] for [employer], [place]

## 2020-06-15 NOTE — Telephone Encounter (Signed)
Bupropion (Wellbutrin) discontinued.   Varenicline (Chantix) prescribed per patient request.   Please schedule appointment follow-up in 4 weeks or sooner if needed for additional refills.

## 2020-06-16 LAB — SURGICAL PATHOLOGY

## 2020-06-19 ENCOUNTER — Other Ambulatory Visit: Payer: Self-pay | Admitting: Family Medicine

## 2020-06-19 DIAGNOSIS — N9089 Other specified noninflammatory disorders of vulva and perineum: Secondary | ICD-10-CM

## 2020-06-20 ENCOUNTER — Telehealth: Payer: Self-pay | Admitting: *Deleted

## 2020-06-20 NOTE — Telephone Encounter (Signed)
Called and scheduled the patient for a new patient appt. Patient stated "I would like to have the appt next week. I start a 90 day treatment for Hep C today and I don't know how it will make me feel." Appt scheduled for 4/29 with Dr Berline Lopes at 11:15 am; patient given an arrival time on 10:45 am. Patient given the address and phone number for the clinic. Patient also given the policy for mask and visitors.

## 2020-06-27 ENCOUNTER — Encounter: Payer: Self-pay | Admitting: Gynecologic Oncology

## 2020-06-28 ENCOUNTER — Other Ambulatory Visit: Payer: Self-pay

## 2020-06-28 ENCOUNTER — Telehealth: Payer: Self-pay | Admitting: Gastroenterology

## 2020-06-28 ENCOUNTER — Ambulatory Visit (AMBULATORY_SURGERY_CENTER): Payer: Self-pay | Admitting: *Deleted

## 2020-06-28 VITALS — Ht 66.0 in | Wt 148.0 lb

## 2020-06-28 DIAGNOSIS — Z1211 Encounter for screening for malignant neoplasm of colon: Secondary | ICD-10-CM

## 2020-06-28 MED ORDER — PLENVU 140 G PO SOLR: 1.0000 | Freq: Once | ORAL | 0 refills | Status: AC

## 2020-06-28 MED ORDER — NA SULFATE-K SULFATE-MG SULF 17.5-3.13-1.6 GM/177ML PO SOLN: 1.0000 | Freq: Once | ORAL | 0 refills | Status: DC

## 2020-06-28 NOTE — Telephone Encounter (Signed)
plenvu sample on 3rd floor with new 2 day prep instructions-pt is aware.

## 2020-06-28 NOTE — Telephone Encounter (Signed)
Inbound call from patient stating prep medication is $75 which she cannot afford and is requesting alternate medication be sent to pharmacy.  Please advise.

## 2020-06-28 NOTE — Progress Notes (Signed)

## 2020-06-29 ENCOUNTER — Ambulatory Visit: Payer: 59 | Admitting: Obstetrics and Gynecology

## 2020-06-30 ENCOUNTER — Other Ambulatory Visit: Payer: Self-pay

## 2020-06-30 ENCOUNTER — Encounter: Payer: Self-pay | Admitting: Gynecologic Oncology

## 2020-06-30 ENCOUNTER — Other Ambulatory Visit: Payer: Self-pay | Admitting: Gynecologic Oncology

## 2020-06-30 ENCOUNTER — Inpatient Hospital Stay: Payer: 59 | Attending: Gynecologic Oncology | Admitting: Gynecologic Oncology

## 2020-06-30 VITALS — BP 120/75 | HR 63 | Temp 98.2°F | Resp 16 | Wt 151.0 lb

## 2020-06-30 DIAGNOSIS — Z79899 Other long term (current) drug therapy: Secondary | ICD-10-CM | POA: Diagnosis not present

## 2020-06-30 DIAGNOSIS — B1920 Unspecified viral hepatitis C without hepatic coma: Secondary | ICD-10-CM | POA: Insufficient documentation

## 2020-06-30 DIAGNOSIS — K219 Gastro-esophageal reflux disease without esophagitis: Secondary | ICD-10-CM | POA: Diagnosis not present

## 2020-06-30 DIAGNOSIS — D071 Carcinoma in situ of vulva: Secondary | ICD-10-CM | POA: Diagnosis not present

## 2020-06-30 DIAGNOSIS — F17210 Nicotine dependence, cigarettes, uncomplicated: Secondary | ICD-10-CM | POA: Diagnosis not present

## 2020-06-30 DIAGNOSIS — G709 Myoneural disorder, unspecified: Secondary | ICD-10-CM | POA: Insufficient documentation

## 2020-06-30 DIAGNOSIS — F419 Anxiety disorder, unspecified: Secondary | ICD-10-CM | POA: Insufficient documentation

## 2020-06-30 DIAGNOSIS — Z720 Tobacco use: Secondary | ICD-10-CM

## 2020-06-30 MED ORDER — SENNOSIDES-DOCUSATE SODIUM 8.6-50 MG PO TABS
2.0000 | ORAL_TABLET | Freq: Every day | ORAL | 0 refills | Status: DC
Start: 2020-06-30 — End: 2020-09-25

## 2020-06-30 MED ORDER — OXYCODONE HCL 5 MG PO TABS: 5.0000 mg | ORAL_TABLET | ORAL | 0 refills | Status: DC | PRN
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
## 2020-06-30 NOTE — Patient Instructions (Addendum)
Preparing for your Surgery  Plan for surgery on Jul 17, 2020 with Dr. Jeral Pinch at [facility]. You will be scheduled for a vulvectomy, possible biopsies, vulvar laser.   Pre-operative Testing -You will receive a phone call from presurgical testing at [facility] to discuss pre-operative instructions, labs, and COVID test. The COVID test normally happens 3 days prior to the surgery and they ask that you self quarantine after the test up until surgery to decrease chance of exposure.  -Bring your insurance card, copy of an advanced directive if applicable, medication list  -You should not be taking blood thinners or aspirin at least ten days prior to surgery unless instructed by your surgeon.  -Do not take supplements such as fish oil (omega 3), red yeast rice, turmeric before your surgery. You want to avoid medications with aspirin in them including headache powders such as BC or Goody's), Excedrin migraine.  Day Before Surgery at [place] will be advised you can have clear liquids up until 3 hours before your surgery.    Your role in recovery Your role is to become active as soon as directed by your doctor, while still giving yourself time to heal.  Rest when you feel tired. You will be asked to do the following in order to speed your recovery:  - Cough and breathe deeply. This helps to clear and expand your lungs and can prevent pneumonia after surgery.  - Damar. Do mild physical activity. Walking or moving your legs help your circulation and body functions return to normal. Do not try to get up or walk alone the first time after surgery.   -If you develop swelling on one leg or the other, pain in the back of your leg, redness/warmth in one of your legs, please call the office or go to the Emergency Room to have a doppler to rule out a blood clot. For shortness of breath, chest pain-seek care in the Emergency Room as soon as  possible. - Actively manage your pain. Managing your pain lets you move in comfort. We will ask you to rate your pain on a scale of zero to 10. It is your responsibility to tell your doctor or nurse where and how much you hurt so your pain can be treated.  Special Considerations -Your final pathology results from surgery should be available around one week after surgery and the results will be relayed to you when available.  -FMLA forms can be faxed to [phone] and please allow 5-7 business days for completion.  Pain Management After Surgery -You have been prescribed your pain medication and bowel regimen medications before surgery so that you can have these available when you are discharged from the hospital. The pain medication is for use ONLY AFTER surgery and a new prescription will not be given.   -Review the attached handout on narcotic use and their risks and side effects.   Bowel Regimen -You have been prescribed Sennakot-S to take nightly to prevent constipation especially if you are taking the narcotic pain medication intermittently.  It is important to prevent constipation and drink adequate amounts of liquids. You can stop taking this medication when you are not taking pain medication and you are back on your normal bowel routine.  Risks of Surgery Risks of surgery are low but include bleeding, infection, damage to surrounding structures, re-operation, blood clots, and very rarely death.  AFTER SURGERY INSTRUCTIONS  Return to work: 2-4  weeks if applicable  Activity: 1. Be up and out of the bed during the day.  Take a nap if needed.  You may walk up steps but be careful and use the hand rail.  Stair climbing will tire you more than you think, you may need to stop part way and rest.   2. No lifting or straining for 1 weeks over 10 pounds. No pushing, pulling, straining for 1 weeks.  3. No driving for minimum of 24 hours.  Do not drive if you are taking narcotic pain medicine  and make sure that your reaction time has returned.   4. You can shower as soon as the next day after surgery.   5. No sexual activity and nothing in the vagina for 4 weeks.  6. You may experience vulvar spotting/discharge after surgery. The spotting is normal but if you experience heavy bleeding, call our office.  Diet: 1. Low sodium Heart Healthy Diet is recommended but you are cleared to resume your normal (before surgery) diet after your procedure.  2. It is safe to use a laxative, such as Miralax or Colace, if you have difficulty moving your bowels. You have been prescribed Sennakot at bedtime every evening to keep bowel movements regular and to prevent constipation.    Wound Care: 1. Keep clean and dry.  Shower daily.  Reasons to call the Doctor:  Fever - Oral temperature greater than 100.4 degrees Fahrenheit  Foul-smelling vaginal discharge  Difficulty urinating  Nausea and vomiting  Increased pain at the site of the incision that is unrelieved with pain medicine.  Difficulty breathing with or without chest pain  New calf pain especially if only on one side  Sudden, continuing increased vaginal bleeding with or without clots.   Contacts: For questions or concerns you should contact:  Dr. Jeral Pinch at [phone]  Joylene John, NP at [phone]  After Hours: call [phone] and have the GYN Oncologist paged/contacted (after 5 pm or on the weekends).  Messages sent via mychart are for non-urgent matters and are not responded to after hours so for urgent needs, please call the after hours number.  Post Operative Instructions Following Laser Surgery  Laser treatment of condyloma (warts) is used to vaporize or eliminate the wart.  The laser actually creates a burn effect on the skin to accomplish this.  The following instructions will help in you comfort postoperatively:  First 24 h  Sit in a tub or sitz bath of COOL water for 20 minutes 1-3 times a  day.  After the bath, carefully blot the area dry and place Silvadene over vulva.  You may sit on a covered ice pack between the baths as needed or on a circular pillow.     If you become uncomfortable, call the office for instructions.   Take your pain medications as prescribed  You may eat whatever you feel like.  Start with a light meal and gradually advance your diet.    Beginning the day after your surgery  Sit in a tub of cool to warm water for 15 minutes at least 3 times a day and after bowel movements  After the bowel movement, clean and dry the area.  Apply Silvadene to the vulva after your baths.    Drainage is usually a pink to tan color and is normal for the following 2-3 weeks after surgery. You can use a peripad to help collect any drainage.  Diet  Eat a regular diet.  Avoid foods  that may constipate you or give you diarrhea.  Avoid foods with seeds, nuts, corn or popcorn.  Beginning the day after surgery, drink 6-8 glasses of water a day in addition to your meals.  Limit you caffeine intake to 1-2 servings per day.  Medication  Take a fiber supplement (Metamucil, Citrucel, FiberCon) twice a day.  Take a stool softener like Colace twice daily  Take your pain medication as directed.  You may use Extra Strength Tylenol instead of your prescribed pain medication to relieve mild discomfort.  Avoid products containing aspirin.  Bowel Habits  You should have a bowel movement at least every other day.  If you are constipated, you may take a Fleet enema or 2 Dulcolax tablets.  Call the office if no results occur.  You may bear down a normal amount to have a bowel movement without hurting your tissues after the operation.  Activity  Walking is encouraged.  You may drive when you are no longer on prescription pain medication.  You may go up and down stairs carefully.  No heavy lifting or strenuous activity until after your first post operative appointment  Do NOT sit  on a rubber ring; instead use a soft pillow if needed.  Call the office if you have any questions or concerns.  Call IMMEDIATELY if you develop:  Fever greater than 100 F  Difficulty urinating
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
## 2020-07-04 ENCOUNTER — Telehealth: Payer: Self-pay | Admitting: Obstetrics & Gynecology

## 2020-07-04 NOTE — Telephone Encounter (Signed)
TC to pt to review surg path. Pt is already scheduled to see Dr. Berline Lopes.  Laura Sloan, M.D., [name]

## 2020-07-05 ENCOUNTER — Other Ambulatory Visit: Payer: Self-pay | Admitting: Infectious Diseases

## 2020-07-05 DIAGNOSIS — L299 Pruritus, unspecified: Secondary | ICD-10-CM

## 2020-07-05 NOTE — Telephone Encounter (Signed)
Please advise on 90 day supply.

## 2020-07-11 ENCOUNTER — Ambulatory Visit: Payer: 59 | Admitting: Infectious Diseases

## 2020-07-11 ENCOUNTER — Encounter (HOSPITAL_BASED_OUTPATIENT_CLINIC_OR_DEPARTMENT_OTHER): Payer: Self-pay | Admitting: Gynecologic Oncology

## 2020-07-11 ENCOUNTER — Telehealth: Payer: Self-pay | Admitting: *Deleted

## 2020-07-11 NOTE — Telephone Encounter (Signed)
Sent office note and surgery stratification/optimization form to ID

## 2020-07-12 ENCOUNTER — Other Ambulatory Visit: Payer: Self-pay

## 2020-07-12 ENCOUNTER — Ambulatory Visit (AMBULATORY_SURGERY_CENTER): Payer: 59 | Admitting: Gastroenterology

## 2020-07-12 ENCOUNTER — Encounter: Payer: Self-pay | Admitting: Gastroenterology

## 2020-07-12 VITALS — BP 125/66 | HR 56 | Temp 97.1°F | Resp 14 | Ht 66.0 in | Wt 148.0 lb

## 2020-07-12 DIAGNOSIS — D125 Benign neoplasm of sigmoid colon: Secondary | ICD-10-CM

## 2020-07-12 DIAGNOSIS — Z1211 Encounter for screening for malignant neoplasm of colon: Secondary | ICD-10-CM

## 2020-07-12 DIAGNOSIS — K635 Polyp of colon: Secondary | ICD-10-CM

## 2020-07-12 HISTORY — PX: COLONOSCOPY WITH PROPOFOL: SHX5780

## 2020-07-12 MED ORDER — SODIUM CHLORIDE 0.9 % IV SOLN: 500.0000 mL | Freq: Once | INTRAVENOUS | Status: DC

## 2020-07-12 NOTE — Patient Instructions (Signed)
Please read handouts provided. Continue present medications. Await pathology results. Use RectiCare or Preparation H for hemorrhoids. Apply pea size amount to rectum twice daily for 1 week.      YOU HAD AN ENDOSCOPIC PROCEDURE TODAY AT [facility]:   Refer to the procedure report that was given to you for any specific questions about what was found during the examination.  If the procedure report does not answer your questions, please call your gastroenterologist to clarify.  If you requested that your care partner not be given the details of your procedure findings, then the procedure report has been included in a sealed envelope for you to review at your convenience later.  YOU SHOULD EXPECT: Some feelings of bloating in the abdomen. Passage of more gas than usual.  Walking can help get rid of the air that was put into your GI tract during the procedure and reduce the bloating. If you had a lower endoscopy (such as a colonoscopy or flexible sigmoidoscopy) you may notice spotting of blood in your stool or on the toilet paper. If you underwent a bowel prep for your procedure, you may not have a normal bowel movement for a few days.  Please Note:  You might notice some irritation and congestion in your nose or some drainage.  This is from the oxygen used during your procedure.  There is no need for concern and it should clear up in a day or so.  SYMPTOMS TO REPORT IMMEDIATELY:   Following lower endoscopy (colonoscopy or flexible sigmoidoscopy):  Excessive amounts of blood in the stool  Significant tenderness or worsening of abdominal pains  Swelling of the abdomen that is new, acute  Fever of 100F or higher   For urgent or emergent issues, a gastroenterologist can be reached at any hour by calling [phone]. Do not use MyChart messaging for urgent concerns.    DIET:  We do recommend a small meal at first, but then you may proceed to your regular diet.  Drink plenty  of fluids but you should avoid alcoholic beverages for 24 hours.  ACTIVITY:  You should plan to take it easy for the rest of today and you should NOT DRIVE or use heavy machinery until tomorrow (because of the sedation medicines used during the test).    FOLLOW UP: Our staff will call the number listed on your records 48-72 hours following your procedure to check on you and address any questions or concerns that you may have regarding the information given to you following your procedure. If we do not reach you, we will leave a message.  We will attempt to reach you two times.  During this call, we will ask if you have developed any symptoms of COVID 19. If you develop any symptoms (ie: fever, flu-like symptoms, shortness of breath, cough etc.) before then, please call [phone].  If you test positive for Covid 19 in the 2 weeks post procedure, please call and report this information to [place].    If any biopsies were taken you will be contacted by phone or by letter within the next 1-3 weeks.  Please call us at [phone] if you have not heard about the biopsies in 3 weeks.    SIGNATURES/CONFIDENTIALITY: You and/or your care partner have signed paperwork which will be entered into your electronic medical record.  These signatures attest to the fact that that the information above on your After Visit Summary has been reviewed and is understood.  Full responsibility of the confidentiality of this discharge information lies with you and/or your care-partner.

## 2020-07-12 NOTE — Progress Notes (Signed)
To PACU, VSS. Report to [email or web address] 

## 2020-07-12 NOTE — Progress Notes (Signed)
Pt's states no medical or surgical changes since previsit or office visit.  Check-in-AER  Vital Signs-[place] 

## 2020-07-12 NOTE — Progress Notes (Signed)
Called to room to assist during endoscopic procedure.  Patient ID and intended procedure confirmed with present staff. Received instructions for my participation in the procedure from the performing physician.  

## 2020-07-12 NOTE — Op Note (Signed)
[place] Patient Name: Laura Sloan Procedure Date: 07/12/2020 8:36 AM MRN: [id number] Endoscopist: Mauri Pole , MD Age: 65 Referring MD:  Date of Birth: 10/20/1955 Gender: Female Account #: [id number] Procedure:                Colonoscopy Indications:              Screening for colorectal malignant neoplasm Medicines:                Monitored Anesthesia Care Procedure:                Pre-Anesthesia Assessment:                           - Prior to the procedure, a History and Physical                            was performed, and patient medications and                            allergies were reviewed. The patient's tolerance of                            previous anesthesia was also reviewed. The risks                            and benefits of the procedure and the sedation                            options and risks were discussed with the patient.                            All questions were answered, and informed consent                            was obtained. Prior Anticoagulants: The patient has                            taken no previous anticoagulant or antiplatelet                            agents. ASA Grade Assessment: II - A patient with                            mild systemic disease. After reviewing the risks                            and benefits, the patient was deemed in                            satisfactory condition to undergo the procedure.                           After obtaining informed consent, the colonoscope  was passed under direct vision. Throughout the                            procedure, the patient's blood pressure, pulse, and                            oxygen saturations were monitored continuously. The                            Olympus PCF-H190DL ([id number]) Colonoscope was                            introduced through the anus and advanced to the the                            cecum,  identified by appendiceal orifice and                            ileocecal valve. The colonoscopy was performed                            without difficulty. The patient tolerated the                            procedure well. The quality of the bowel                            preparation was good. The ileocecal valve,                            appendiceal orifice, and rectum were photographed. Scope In: 8:42:25 AM Scope Out: 8:56:50 AM Scope Withdrawal Time: 0 hours 9 minutes 59 seconds  Total Procedure Duration: 0 hours 14 minutes 25 seconds  Findings:                 The perianal and digital rectal examinations were                            normal.                           A 8 mm polyp was found in the sigmoid colon. The                            polyp was sessile. The polyp was removed with a                            cold snare. Resection and retrieval were complete.                           Non-bleeding external and internal hemorrhoids were                            found during retroflexion. The hemorrhoids were  medium-sized. Complications:            No immediate complications. Estimated Blood Loss:     Estimated blood loss was minimal. Impression:               - One 8 mm polyp in the sigmoid colon, removed with                            a cold snare. Resected and retrieved.                           - Non-bleeding external and internal hemorrhoids. Recommendation:           - Patient has a contact number available for                            emergencies. The signs and symptoms of potential                            delayed complications were discussed with the                            patient. Return to normal activities tomorrow.                            Written discharge instructions were provided to the                            patient.                           - Resume previous diet.                           - Continue  present medications.                           - Await pathology results.                           - Repeat colonoscopy in 5-10 years for surveillance                            based on pathology results. Mauri Pole, MD 07/12/2020 9:00:08 AM This report has been signed electronically.

## 2020-07-13 ENCOUNTER — Other Ambulatory Visit: Payer: Self-pay

## 2020-07-13 ENCOUNTER — Telehealth: Payer: Self-pay

## 2020-07-13 ENCOUNTER — Encounter (HOSPITAL_BASED_OUTPATIENT_CLINIC_OR_DEPARTMENT_OTHER): Payer: Self-pay | Admitting: Gynecologic Oncology

## 2020-07-13 NOTE — Telephone Encounter (Signed)
Ms Bakken states that she understands her pre-op instructions. She has no questions or concerns at this time.

## 2020-07-13 NOTE — Progress Notes (Addendum)
ADDENDUM:  Chart reviewed w/ anesthesia, Dr Kalman Shan MDA, ok to proceed.   Spoke w/ via phone for pre-op interview--- PT Lab needs dos---- no              Lab results------ no COVID test ------ 07-14-2020 @ 0830 Arrive at ------- 0600 on 07-17-2020 NPO after MN NO Solid Food.  Clear liquids from MN until--- 0500 Med rec completed Medications to take morning of surgery ----- Hydroxyzine, Prilosec Diabetic medication ----- n/a Patient instructed to bring photo id and insurance card day of surgery Patient aware to have Driver (ride ) / caregiver    for 24 hours after surgery -- sig other, Laura Sloan Patient Special Instructions ----- n/a Pre-Op special Istructions ----- n/a Patient verbalized understanding of instructions that were given at this phone interview. Patient denies shortness of breath, chest pain, fever, cough at this phone interview.   Anesthesia :  Pt currently treated with  Epclusa for hepatitis C, started 03/ 2022, with cirrhosis non-alcoholic.  Per pt was told by ID that she can have surgery while on this medication.  PCP: Durene Fruits NP ID:  Janene Madeira NP (lov 06-13-2020 epic) Chest x-ray : chest CT 05-04-2020 Activity level: denies sob w/ any activity Sleep Study/ CPAP :  NO

## 2020-07-14 ENCOUNTER — Other Ambulatory Visit (HOSPITAL_COMMUNITY)
Admission: RE | Admit: 2020-07-14 | Discharge: 2020-07-14 | Disposition: A | Discharge status: Home / Self Care | Payer: 59 | Source: Ambulatory Visit | Attending: Gynecologic Oncology | Admitting: Gynecologic Oncology

## 2020-07-14 ENCOUNTER — Telehealth: Payer: Self-pay

## 2020-07-14 ENCOUNTER — Encounter: Payer: Self-pay | Admitting: Infectious Diseases

## 2020-07-14 DIAGNOSIS — Z20822 Contact with and (suspected) exposure to covid-19: Secondary | ICD-10-CM | POA: Insufficient documentation

## 2020-07-14 DIAGNOSIS — Z01812 Encounter for preprocedural laboratory examination: Secondary | ICD-10-CM | POA: Insufficient documentation

## 2020-07-14 LAB — SARS CORONAVIRUS 2 (TAT 6-24 HRS): SARS Coronavirus 2: NEGATIVE

## 2020-07-14 NOTE — Telephone Encounter (Signed)
Spoke with patient regarding her prescribed medications. We discussed that it is important that she should continue taking her Epclusa as prescribed and not miss any doses. Pt verbalizes understanding.

## 2020-07-14 NOTE — Telephone Encounter (Signed)
Faxed surgical clearance form and letter written by Janene Madeira, NP to [facility] at [phone].   Beryle Flock, RN

## 2020-07-14 NOTE — Telephone Encounter (Signed)
LVM

## 2020-07-16 NOTE — Anesthesia Preprocedure Evaluation (Addendum)
Anesthesia Evaluation  Patient identified by MRN, date of birth, ID band Patient awake    Reviewed: Allergy & Precautions, NPO status , Patient's Chart, lab work & pertinent test results  Airway Mallampati: II  TM Distance: >3 FB Neck ROM: Full    Dental  (+) Lower Dentures, Upper Dentures   Pulmonary neg pulmonary ROS, Current Smoker,    Pulmonary exam normal breath sounds clear to auscultation       Cardiovascular negative cardio ROS Normal cardiovascular exam Rhythm:Regular Rate:Normal     Neuro/Psych PSYCHIATRIC DISORDERS Anxiety negative neurological ROS  negative psych ROS   GI/Hepatic negative GI ROS, Neg liver ROS, GERD  ,(+) Hepatitis -, C  Endo/Other  negative endocrine ROS  Renal/GU negative Renal ROS  negative genitourinary   Musculoskeletal negative musculoskeletal ROS (+) Arthritis ,   Abdominal   Peds negative pediatric ROS (+)  Hematology negative hematology ROS (+)   Anesthesia Other Findings   Reproductive/Obstetrics negative OB ROS                            Anesthesia Physical Anesthesia Plan  ASA: II  Anesthesia Plan: General   Post-op Pain Management:    Induction: Intravenous  PONV Risk Score and Plan: 2 and Ondansetron, Dexamethasone, Treatment may vary due to age or medical condition and Midazolam  Airway Management Planned: LMA  Additional Equipment: None  Intra-op Plan:   Post-operative Plan: Extubation in OR  Informed Consent: I have reviewed the patients History and Physical, chart, labs and discussed the procedure including the risks, benefits and alternatives for the proposed anesthesia with the patient or authorized representative who has indicated his/her understanding and acceptance.     Dental advisory given  Plan Discussed with: CRNA  Anesthesia Plan Comments:         Anesthesia Quick Evaluation

## 2020-07-17 ENCOUNTER — Ambulatory Visit (HOSPITAL_BASED_OUTPATIENT_CLINIC_OR_DEPARTMENT_OTHER): Payer: 59 | Admitting: Anesthesiology

## 2020-07-17 ENCOUNTER — Ambulatory Visit (HOSPITAL_BASED_OUTPATIENT_CLINIC_OR_DEPARTMENT_OTHER)
Admission: RE | Admit: 2020-07-17 | Discharge: 2020-07-17 | Disposition: A | Discharge status: Home / Self Care | Payer: 59 | Attending: Gynecologic Oncology | Admitting: Gynecologic Oncology

## 2020-07-17 ENCOUNTER — Encounter (HOSPITAL_BASED_OUTPATIENT_CLINIC_OR_DEPARTMENT_OTHER): Payer: Self-pay | Admitting: Gynecologic Oncology

## 2020-07-17 ENCOUNTER — Encounter (HOSPITAL_BASED_OUTPATIENT_CLINIC_OR_DEPARTMENT_OTHER)
Admission: RE | Disposition: A | Discharge status: Home / Self Care | Payer: Self-pay | Source: Home / Self Care | Attending: Gynecologic Oncology

## 2020-07-17 ENCOUNTER — Telehealth: Payer: Self-pay | Admitting: *Deleted

## 2020-07-17 DIAGNOSIS — Z79899 Other long term (current) drug therapy: Secondary | ICD-10-CM | POA: Diagnosis not present

## 2020-07-17 DIAGNOSIS — Z800 Family history of malignant neoplasm of digestive organs: Secondary | ICD-10-CM | POA: Insufficient documentation

## 2020-07-17 DIAGNOSIS — B1920 Unspecified viral hepatitis C without hepatic coma: Secondary | ICD-10-CM | POA: Diagnosis not present

## 2020-07-17 DIAGNOSIS — D071 Carcinoma in situ of vulva: Secondary | ICD-10-CM | POA: Insufficient documentation

## 2020-07-17 DIAGNOSIS — K7460 Unspecified cirrhosis of liver: Secondary | ICD-10-CM | POA: Diagnosis not present

## 2020-07-17 DIAGNOSIS — K219 Gastro-esophageal reflux disease without esophagitis: Secondary | ICD-10-CM | POA: Diagnosis not present

## 2020-07-17 DIAGNOSIS — N903 Dysplasia of vulva, unspecified: Secondary | ICD-10-CM

## 2020-07-17 DIAGNOSIS — F17200 Nicotine dependence, unspecified, uncomplicated: Secondary | ICD-10-CM | POA: Diagnosis not present

## 2020-07-17 DIAGNOSIS — Z8371 Family history of colonic polyps: Secondary | ICD-10-CM | POA: Diagnosis not present

## 2020-07-17 DIAGNOSIS — Z8051 Family history of malignant neoplasm of kidney: Secondary | ICD-10-CM | POA: Diagnosis not present

## 2020-07-17 HISTORY — DX: Other seasonal allergic rhinitis: J30.2

## 2020-07-17 HISTORY — DX: Presence of spectacles and contact lenses: Z97.3

## 2020-07-17 HISTORY — DX: Presence of dental prosthetic device (complete) (partial): K08.109

## 2020-07-17 HISTORY — PX: VULVECTOMY: SHX1086

## 2020-07-17 HISTORY — PX: CO2 LASER APPLICATION: SHX5778

## 2020-07-17 HISTORY — DX: Complete loss of teeth, unspecified cause, unspecified class: Z97.2

## 2020-07-17 HISTORY — DX: Unspecified osteoarthritis, unspecified site: M19.90

## 2020-07-17 HISTORY — DX: Chronic obstructive pulmonary disease, unspecified: J44.9

## 2020-07-17 HISTORY — DX: Simple chronic bronchitis: J41.0

## 2020-07-17 HISTORY — DX: Other specified abnormal immunological findings in serum: R76.8

## 2020-07-17 HISTORY — DX: Family history of other specified conditions: Z84.89

## 2020-07-17 SURGERY — WIDE EXCISION VULVECTOMY
Anesthesia: General | Site: Vagina

## 2020-07-17 MED ORDER — GABAPENTIN 100 MG PO CAPS
ORAL_CAPSULE | ORAL | Status: AC
  Filled 2020-07-17: qty 2

## 2020-07-17 MED ORDER — PROPOFOL 10 MG/ML IV BOLUS
INTRAVENOUS | Status: DC | PRN
  Administered 2020-07-17: 180 mg via INTRAVENOUS

## 2020-07-17 MED ORDER — OXYCODONE HCL 5 MG/5ML PO SOLN: 5.0000 mg | Freq: Once | ORAL | Status: AC | PRN

## 2020-07-17 MED ORDER — LIDOCAINE 2% (20 MG/ML) 5 ML SYRINGE
INTRAMUSCULAR | Status: DC | PRN
  Administered 2020-07-17: 60 mg via INTRAVENOUS

## 2020-07-17 MED ORDER — MIDAZOLAM HCL 2 MG/2ML IJ SOLN
INTRAMUSCULAR | Status: DC | PRN
  Administered 2020-07-17: 2 mg via INTRAVENOUS

## 2020-07-17 MED ORDER — FENTANYL CITRATE (PF) 100 MCG/2ML IJ SOLN
INTRAMUSCULAR | Status: AC
  Filled 2020-07-17: qty 2

## 2020-07-17 MED ORDER — DEXAMETHASONE SODIUM PHOSPHATE 10 MG/ML IJ SOLN
4.0000 mg | INTRAMUSCULAR | Status: DC
Start: 2020-07-17 — End: 2020-07-17

## 2020-07-17 MED ORDER — LACTATED RINGERS IV SOLN: INTRAVENOUS | Status: DC

## 2020-07-17 MED ORDER — LIDOCAINE 2% (20 MG/ML) 5 ML SYRINGE
INTRAMUSCULAR | Status: AC
  Filled 2020-07-17: qty 5

## 2020-07-17 MED ORDER — ONDANSETRON HCL 4 MG/2ML IJ SOLN
INTRAMUSCULAR | Status: AC
  Filled 2020-07-17: qty 2

## 2020-07-17 MED ORDER — FENTANYL CITRATE (PF) 100 MCG/2ML IJ SOLN: 25.0000 ug | INTRAMUSCULAR | Status: DC | PRN

## 2020-07-17 MED ORDER — OXYCODONE HCL 5 MG PO TABS
5.0000 mg | ORAL_TABLET | Freq: Once | ORAL | Status: AC | PRN
Start: 2020-07-17 — End: 2020-07-17
  Administered 2020-07-17: 5 mg via ORAL

## 2020-07-17 MED ORDER — PROPOFOL 10 MG/ML IV BOLUS
INTRAVENOUS | Status: AC
  Filled 2020-07-17: qty 20

## 2020-07-17 MED ORDER — FENTANYL CITRATE (PF) 100 MCG/2ML IJ SOLN
INTRAMUSCULAR | Status: DC | PRN
  Administered 2020-07-17: 50 ug via INTRAVENOUS
  Administered 2020-07-17 (×2): 25 ug via INTRAVENOUS

## 2020-07-17 MED ORDER — DEXAMETHASONE SODIUM PHOSPHATE 10 MG/ML IJ SOLN
INTRAMUSCULAR | Status: DC | PRN
  Administered 2020-07-17: 5 mg via INTRAVENOUS

## 2020-07-17 MED ORDER — GABAPENTIN 100 MG PO CAPS
200.0000 mg | ORAL_CAPSULE | ORAL | Status: AC
  Administered 2020-07-17: 200 mg via ORAL

## 2020-07-17 MED ORDER — OXYCODONE HCL 5 MG PO TABS
ORAL_TABLET | ORAL | Status: AC
  Filled 2020-07-17: qty 1

## 2020-07-17 MED ORDER — DROPERIDOL 2.5 MG/ML IJ SOLN: 0.6250 mg | Freq: Once | INTRAMUSCULAR | Status: DC | PRN

## 2020-07-17 MED ORDER — SCOPOLAMINE 1 MG/3DAYS TD PT72
MEDICATED_PATCH | TRANSDERMAL | Status: AC
  Filled 2020-07-17: qty 1

## 2020-07-17 MED ORDER — MIDAZOLAM HCL 2 MG/2ML IJ SOLN
INTRAMUSCULAR | Status: AC
  Filled 2020-07-17: qty 2

## 2020-07-17 MED ORDER — SCOPOLAMINE 1 MG/3DAYS TD PT72
1.0000 | MEDICATED_PATCH | TRANSDERMAL | Status: DC
  Administered 2020-07-17: 1.5 mg via TRANSDERMAL

## 2020-07-17 MED ORDER — DEXAMETHASONE SODIUM PHOSPHATE 10 MG/ML IJ SOLN
INTRAMUSCULAR | Status: AC
  Filled 2020-07-17: qty 1

## 2020-07-17 MED ORDER — ONDANSETRON HCL 4 MG/2ML IJ SOLN
INTRAMUSCULAR | Status: DC | PRN
  Administered 2020-07-17: 4 mg via INTRAVENOUS

## 2020-07-17 MED ORDER — BUPIVACAINE HCL 0.25 % IJ SOLN
INTRAMUSCULAR | Status: DC | PRN
  Administered 2020-07-17: 6 mL

## 2020-07-17 MED ORDER — PROMETHAZINE HCL 25 MG/ML IJ SOLN
6.2500 mg | INTRAMUSCULAR | Status: DC | PRN
Start: 2020-07-17 — End: 2020-07-17

## 2020-07-17 SURGICAL SUPPLY — 46 items
BLADE CLIPPER SENSICLIP SURGIC (BLADE) IMPLANT
BLADE SURG 15 STRL LF DISP TIS (BLADE) ×2 IMPLANT
BLADE SURG 15 STRL SS (BLADE) ×3
CANISTER SUCT 1200ML W/VALVE (MISCELLANEOUS) IMPLANT
CATH ROBINSON RED A/P 16FR (CATHETERS) IMPLANT
COVER WAND RF STERILE (DRAPES) ×3 IMPLANT
DEPRESSOR TONGUE BLADE STERILE (MISCELLANEOUS) ×3 IMPLANT
DRSG TELFA 3X8 NADH (GAUZE/BANDAGES/DRESSINGS) IMPLANT
ELECT BALL LEEP 3MM BLK (ELECTRODE) IMPLANT
ELECT PENCIL ROCKER SW 15FT (MISCELLANEOUS) ×2 IMPLANT
GAUZE 4X4 16PLY RFD (DISPOSABLE) ×3 IMPLANT
GLOVE SURG ENC MOIS LTX SZ6 (GLOVE) ×6 IMPLANT
GLOVE SURG ENC MOIS LTX SZ7 (GLOVE) ×2 IMPLANT
GLOVE SURG ENC TEXT LTX SZ6.5 (GLOVE) ×2 IMPLANT
GOWN STRL REUS W/TWL LRG LVL3 (GOWN DISPOSABLE) ×7 IMPLANT
KIT TURNOVER CYSTO (KITS) ×3 IMPLANT
NDL HYPO 25X1 1.5 SAFETY (NEEDLE) ×1 IMPLANT
NEEDLE HYPO 25X1 1.5 SAFETY (NEEDLE) ×3 IMPLANT
NS IRRIG 500ML POUR BTL (IV SOLUTION) ×5 IMPLANT
PACK PERINEAL COLD (PAD) ×3 IMPLANT
PACK VAGINAL MINOR WOMEN LF (CUSTOM PROCEDURE TRAY) ×2 IMPLANT
PACK VAGINAL WOMENS (CUSTOM PROCEDURE TRAY) ×1 IMPLANT
PAD DRESSING TELFA 3X8 NADH (GAUZE/BANDAGES/DRESSINGS) IMPLANT
PAD PREP 24X48 CUFFED NSTRL (MISCELLANEOUS) ×3 IMPLANT
PUNCH BIOPSY DERMAL 3 (INSTRUMENTS) IMPLANT
PUNCH BIOPSY DERMAL 3MM (INSTRUMENTS)
PUNCH BIOPSY DERMAL 4MM (INSTRUMENTS) IMPLANT
SUT VIC AB 0 CT1 36 (SUTURE) IMPLANT
SUT VIC AB 0 SH 27 (SUTURE) ×3 IMPLANT
SUT VIC AB 2-0 CT2 27 (SUTURE) IMPLANT
SUT VIC AB 2-0 SH 27 (SUTURE)
SUT VIC AB 2-0 SH 27XBRD (SUTURE) IMPLANT
SUT VIC AB 3-0 PS2 18 (SUTURE)
SUT VIC AB 3-0 PS2 18XBRD (SUTURE) IMPLANT
SUT VIC AB 3-0 SH 27 (SUTURE) ×3
SUT VIC AB 3-0 SH 27X BRD (SUTURE) ×2 IMPLANT
SUT VIC AB 4-0 PS2 18 (SUTURE) ×3 IMPLANT
SUT VICRYL 0 UR6 27IN ABS (SUTURE) ×2 IMPLANT
SWAB OB GYN 8IN STERILE 2PK (MISCELLANEOUS) ×2 IMPLANT
SYR BULB IRRIG 60ML STRL (SYRINGE) ×3 IMPLANT
TOWEL OR 17X26 10 PK STRL BLUE (TOWEL DISPOSABLE) ×3 IMPLANT
TUBE CONNECTING 12X1/4 (SUCTIONS) ×2 IMPLANT
VACUUM HOSE 7/8X10 W/ WAND (MISCELLANEOUS) IMPLANT
VACUUM HOSE/TUBING 7/8INX6FT (MISCELLANEOUS) ×3 IMPLANT
WATER STERILE IRR 500ML POUR (IV SOLUTION) ×3 IMPLANT
YANKAUER SUCT BULB TIP NO VENT (SUCTIONS) ×2 IMPLANT

## 2020-07-17 NOTE — Anesthesia Postprocedure Evaluation (Signed)
Anesthesia Post Note  Patient: Laura Sloan  Procedure(s) Performed: WIDE EXCISION VULVECTOMY (N/A Vagina ) CO2 LASER APPLICATION TO VULVA (N/A Vagina )     Patient location during evaluation: PACU Anesthesia Type: General Level of consciousness: sedated Pain management: pain level controlled Vital Signs Assessment: post-procedure vital signs reviewed and stable Respiratory status: spontaneous breathing and respiratory function stable Cardiovascular status: stable Postop Assessment: no apparent nausea or vomiting Anesthetic complications: no   No complications documented.  Last Vitals:  Vitals:   07/17/20 0845 07/17/20 0900  BP: 132/74 139/78  Pulse: (!) 59 (!) 59  Resp: 15 15  Temp:    SpO2: 97% 96%    Last Pain:  Vitals:   07/17/20 0900  TempSrc:   PainSc: 0-No pain                 Merlinda Frederick

## 2020-07-17 NOTE — Op Note (Signed)
PATIENT: Laura Sloan DATE: 07/17/20  Preop Diagnosis: VIN3  Postoperative Diagnosis: same as above  Surgery: Partial simple left vulvectomy, CO2 laser ablation  Surgeons:  Valarie Cones, MD  Assistant: Joylene John, NP  Anesthesia: General   Estimated blood loss: 10 ml  IVF:  See I&O flowsheet  Urine output: n/a   Complications: None apparent  Pathology: Left vulva with marking stitch at 12 o'clock  Operative findings: Approximately 2x2cm area of patches of leukoplakia noted along labia minora and majora of left superior vulva with skin changes extending onto the left lateral aspect of the clitoral hood and clitoris. No other vulvar or vaginal abnormalities seen.  Procedure: The patient was identified in the preoperative holding area. Informed consent was signed on the chart. Patient was seen history was reviewed and exam was performed.   The patient was then taken to the operating room and placed in the supine position with SCD hose on. General anesthesia was then induced without difficulty. She was then placed in the dorsolithotomy position. The perineum was prepped with Betadine. The vagina was prepped with Betadine. The patient was then draped after the prep was dried.  Surgical field was draped with wet towels. The staff and patient ensured laser-safe eyewear and masks were fitted. The laser was set to 8 watts continuous. The laser was tested for accuracy on a tongue depressor.  Timeout was performed the patient, procedure, antibiotic, allergy, and length of procedure. The vulvar tissues were inspected for areas of acetowhite changes or leukoplakia. The lesion was identified and the marking pen was used to circumscribe the area with appropriate surgical margins. The subcuticular tissues were infiltrated with 0.25% marcaine. The 15 blade scalpel was used to make an incision through the skin circumferentially as marked. The skin elipse was grasped and was separated  from the underlying deep dermal tissues with thescalpel. After the specimen had been completely resected, it was oriented and marked at 12 o'clock with a 0-vicryl suture. The bovie was used to obtain hemostasis at the surgical bed. The subcutaneous tissues were irrigated and made hemostatic.    The laser was applied to the medial aspect along the clitoral hood and clitoris. The tissue was ablated to the desired depth and the eschar was removed with a moistened sponge. When the entire remaining lesion had been ablated the procedure was complete.  The deep dermal layer of the excision was approximated with 2-0vicryl suture in a running fashion to bring the skin edges into approximation and off tension. The wound was closed following langher's lines. The cutaneous layer was closed with interrupted 3-0 vicryl stitches and mattress sutures to ensure a tension free and hemostatic closure. The perineum was again irrigated.   All instrument, suture, laparotomy, Ray-Tec, and needle counts were correct x2.   The patient tolerated the procedure well and was taken recovery room in stable condition.  Jeral Pinch MD Gynecologic Oncology

## 2020-07-17 NOTE — Transfer of Care (Signed)
Immediate Anesthesia Transfer of Care Note  Patient: Laura Sloan  Procedure(s) Performed: Procedure(s) (LRB): WIDE EXCISION VULVECTOMY (N/A) CO2 LASER APPLICATION TO VULVA (N/A)  Patient Location: PACU  Anesthesia Type: General  Level of Consciousness: awake, oriented, sedated and patient cooperative  Airway & Oxygen Therapy: Patient Spontanous Breathing and Patient connected to face mask oxygen  Post-op Assessment: Report given to PACU RN and Post -op Vital signs reviewed and stable  Post vital signs: Reviewed and stable  Complications: No apparent anesthesia complications  Last Vitals:  Vitals Value Taken Time  BP 130/75 07/17/20 0828  Temp 36.3 C 07/17/20 0828  Pulse 55 07/17/20 0829  Resp 12 07/17/20 0829  SpO2 97 % 07/17/20 0829  Vitals shown include unvalidated device data.  Last Pain:  Vitals:   07/17/20 0625  TempSrc: Oral  PainSc: 0-No pain      Patients Stated Pain Goal: 5 (50/53/97 6734)  Complications: No complications documented.

## 2020-07-17 NOTE — Anesthesia Procedure Notes (Signed)
Procedure Name: LMA Insertion Date/Time: 07/17/2020 7:50 AM Performed by: Suan Halter, CRNA Pre-anesthesia Checklist: Patient identified, Emergency Drugs available, Suction available and Patient being monitored Patient Re-evaluated:Patient Re-evaluated prior to induction Oxygen Delivery Method: Circle system utilized Preoxygenation: Pre-oxygenation with 100% oxygen Induction Type: IV induction Ventilation: Mask ventilation without difficulty LMA: LMA inserted LMA Size: 4.0 Number of attempts: 1 Airway Equipment and Method: Bite block Placement Confirmation: positive ETCO2 Tube secured with: Tape Dental Injury: Teeth and Oropharynx as per pre-operative assessment

## 2020-07-17 NOTE — Interval H&P Note (Signed)
History and Physical Interval Note:  07/17/2020 7:06 AM  Laura Sloan  has presented today for surgery, with the diagnosis of VULVAR DYSPLASIA.  The various methods of treatment have been discussed with the patient and family. After consideration of risks, benefits and other options for treatment, the patient has consented to  Procedure(s): WIDE EXCISION VULVECTOMY (N/A) CO2 LASER APPLICATION TO VULVA (N/A) POSSIBLE VULVAR BIOPSY (N/A) as a surgical intervention.  The patient's history has been reviewed, patient examined, no change in status, stable for surgery.  I have reviewed the patient's chart and labs.  Questions were answered to the patient's satisfaction.     Lafonda Mosses

## 2020-07-17 NOTE — Telephone Encounter (Signed)
Patient called and stated "I wanted to see if I needed silvadene cream after surgery?" Per Melissa APP patient doesn't need the cream just use frozen peas.

## 2020-07-17 NOTE — Discharge Instructions (Addendum)
07/17/2020  Return to work: 2-4 weeks if applicable  We recommend purchasing several bags of frozen green peas and dividing them into ziploc bags. You will want to keep these in the freezer and have them ready to use as ice packs to the vulvar incision. Once the ice pack is no longer cold, you can get another from the freezer. The frozen peas mold to your body better than a regular ice pack.  Activity: 1. Be up and out of the bed during the day.  Take a nap if needed.  You may walk up steps but be careful and use the hand rail.  Stair climbing will tire you more than you think, you may need to stop part way and rest.   2. No lifting or straining for 1-2 weeks.  3. No driving for minimum of 24 hours after the procedure if you were cleared to drive before.  Do not drive if you are taking narcotic pain medicine.  4. Shower daily.  Pat your incision dry; don't rub.  No tub baths until cleared by your surgeon.   5. No sexual activity and nothing in the vagina for 4 weeks.  6. You may experience a small amount of spotting and/or drainage from your incision, which is normal.  If the drainage persists or increases, please call the office.  Diet: 1. Low sodium Heart Healthy Diet is recommended.  2. It is safe to use a laxative, such as Miralax or Colace, if you have difficulty moving your bowels. You can take Sennakot at bedtime every evening to keep bowel movements regular and to prevent constipation.    Wound Care: 1. Keep clean and dry.  Shower daily.  Reasons to call the Doctor:  Fever - Oral temperature greater than 100.4 degrees Fahrenheit  Foul-smelling vaginal discharge  Difficulty urinating  Nausea and vomiting  Increased pain at the site of the incision that is unrelieved with pain medicine.  Difficulty breathing with or without chest pain  New calf pain especially if only on one side  Sudden, continuing increased vaginal bleeding with or without clots.   Contacts: For  questions or concerns you should contact:  Dr. Jeral Pinch at [phone]  Joylene John, NP at [phone]  After Hours: call [phone] and have the GYN Oncologist paged/contacted   Post Anesthesia Home Care Instructions  Activity: Get plenty of rest for the remainder of the day. A responsible individual must stay with you for 24 hours following the procedure.  For the next 24 hours, DO NOT: -Drive a car -Paediatric nurse -Drink alcoholic beverages -Take any medication unless instructed by your physician -Make any legal decisions or sign important papers.  Meals: Start with liquid foods such as gelatin or soup. Progress to regular foods as tolerated. Avoid greasy, spicy, heavy foods. If nausea and/or vomiting occur, drink only clear liquids until the nausea and/or vomiting subsides. Call your physician if vomiting continues.  Special Instructions/Symptoms: Your throat may feel dry or sore from the anesthesia or the breathing tube placed in your throat during surgery. If this causes discomfort, gargle with warm salt water. The discomfort should disappear within 24 hours.  If you had a scopolamine patch placed behind your ear for the management of post- operative nausea and/or vomiting:  1. The medication in the patch is effective for 72 hours, after which it should be removed.  Wrap patch in a tissue and discard in the trash. Wash hands thoroughly with soap and water. 2. You may  remove the patch earlier than 72 hours if you experience unpleasant side effects which may include dry mouth, dizziness or visual disturbances. 3. Avoid touching the patch. Wash your hands with soap and water after contact with the patch.    Remove patch behind right ear by Thursday, Jul 20, 2020.

## 2020-07-18 ENCOUNTER — Telehealth: Payer: Self-pay | Admitting: *Deleted

## 2020-07-18 NOTE — Telephone Encounter (Signed)
I spoke with Ms. Seaborn this morning. She rates her pain 5/10 which is adequate for her. She took one oxycodone last night, but mainly using frozen peas for pain relief. She is eating, drinking and urinating without difficulty. She is passing gas, but no BM yet. She is using Senakot as prescribed. She denies fever or chills. She is aware of her upcoming appt with doctor Berline Lopes and she has the clinic number. She will call with any questions or concerns.

## 2020-07-19 ENCOUNTER — Encounter (HOSPITAL_BASED_OUTPATIENT_CLINIC_OR_DEPARTMENT_OTHER): Payer: Self-pay | Admitting: Gynecologic Oncology

## 2020-07-19 ENCOUNTER — Telehealth: Payer: Self-pay | Admitting: Gynecologic Oncology

## 2020-07-19 LAB — SURGICAL PATHOLOGY

## 2020-07-19 NOTE — Telephone Encounter (Signed)
Called patient, reviewed surgery and pathology results. She is healing well. Discussed post-op expectations. All questions answered.  Jeral Pinch MD Gynecologic Oncology

## 2020-07-21 ENCOUNTER — Encounter: Payer: Self-pay | Admitting: Gastroenterology

## 2020-07-25 ENCOUNTER — Ambulatory Visit: Payer: Self-pay | Admitting: Neurology

## 2020-07-26 ENCOUNTER — Other Ambulatory Visit: Payer: Self-pay | Admitting: Gynecologic Oncology

## 2020-07-26 ENCOUNTER — Telehealth: Payer: Self-pay

## 2020-07-26 DIAGNOSIS — D071 Carcinoma in situ of vulva: Secondary | ICD-10-CM

## 2020-07-26 MED ORDER — OXYCODONE HCL 5 MG PO TABS
5.0000 mg | ORAL_TABLET | ORAL | 0 refills | Status: DC | PRN
Start: 2020-07-26 — End: 2020-09-25

## 2020-07-26 NOTE — Progress Notes (Signed)
See RN note.

## 2020-07-26 NOTE — Telephone Encounter (Addendum)
RN received call from Ms. Leece requesting pain medication refill post-op. Patient rates pain as an 8.5 when up and moving. She states an ice pack helps when she is resting but the pain is constant.  Joylene John, NP notified and prescription refill has been sent, patient is aware. She will call if any questions or concerns.

## 2020-07-27 DIAGNOSIS — Z9189 Other specified personal risk factors, not elsewhere classified: Secondary | ICD-10-CM | POA: Insufficient documentation

## 2020-07-27 DIAGNOSIS — L299 Pruritus, unspecified: Secondary | ICD-10-CM | POA: Insufficient documentation

## 2020-07-27 NOTE — Assessment & Plan Note (Addendum)
Pruritis w/o rash. Trial of hydroxyzine for PRN use. Unclear if related to hep c or other condition (allergies?)  Stay hydrated, tepid baths, moisturizers.

## 2020-07-27 NOTE — Assessment & Plan Note (Signed)
Counseled re: drug interaction risk with omeprazole = to take no more than 20 mg once daily MAX with Epclusa; preferably 4 hours AFTER the Epclusa dose. Would prefer to try to take this as little as possible.

## 2020-08-01 ENCOUNTER — Ambulatory Visit (INDEPENDENT_AMBULATORY_CARE_PROVIDER_SITE_OTHER): Payer: 59 | Admitting: Infectious Diseases

## 2020-08-01 ENCOUNTER — Other Ambulatory Visit: Payer: Self-pay

## 2020-08-01 VITALS — BP 134/76 | HR 62 | Temp 97.9°F | Wt 151.0 lb

## 2020-08-01 DIAGNOSIS — L299 Pruritus, unspecified: Secondary | ICD-10-CM | POA: Diagnosis not present

## 2020-08-01 DIAGNOSIS — B1920 Unspecified viral hepatitis C without hepatic coma: Secondary | ICD-10-CM | POA: Diagnosis not present

## 2020-08-01 DIAGNOSIS — K7469 Other cirrhosis of liver: Secondary | ICD-10-CM

## 2020-08-01 DIAGNOSIS — R768 Other specified abnormal immunological findings in serum: Secondary | ICD-10-CM | POA: Diagnosis not present

## 2020-08-01 DIAGNOSIS — L989 Disorder of the skin and subcutaneous tissue, unspecified: Secondary | ICD-10-CM | POA: Diagnosis not present

## 2020-08-01 NOTE — Assessment & Plan Note (Addendum)
She has several telangectasias noted on the skin that are non-painful. Palmar erythema noted today.  External R ankle with more tender patch of erythematous skin - ?Acral necrolytic erythema (though no necrosis) vs porphyria cutanea tarda vs erythema nodosum (though no nodules present).  I would suspect most of these features are related to underlying cirrhosis. Will follow up liver and kidney function tests today.

## 2020-08-01 NOTE — Assessment & Plan Note (Signed)
Improved on hydroxyzine - continue Rx.

## 2020-08-01 NOTE — Patient Instructions (Addendum)
Stop by the lab on your way out please - will let you know what your liver results look like  Continue your medication  I do suspect that the skin findings are related to your underlying cirrhosis and not the medication itself.   Would continue to keep moisturizer on as you are, use the hydroxyzine for itching.   Will see you back again in 6 weeks right at the end of your medication course. Will check in on your liver again and get you your next hepatitis B vaccine. Would also like to give you your pneumonia vaccine then as well (Prevnar).

## 2020-08-01 NOTE — Progress Notes (Signed)
Patient Name: Laura Sloan  Date of Birth: 01-Feb-1956  MRN: [id number]  PCP: Camillia Herter, NP  Referring Provider: Camillia Herter, NP, Ph#: [phone]   CC:  Hepatitis C follow up  New skin findings to look at.  Tingling is better!    HPI:  Laura Sloan is a 65 y.o. female with chronic hepatitis C infection due to genotype 1a. Fibrotest and Elastography indicate cirrhosis/F4; Child Pugh A, compensated disease.   She has been on her Epclusa for about 6 weeks now - taking her medication every night at 8:00 pm. No missed doses and she feels she has tolerated it well. She has over the last few weeks noticed some waxing/waning tender red circular appearing rashes on the lower extremities - she has one present today. They feel pretty flat and would not describe them to be nodular.  This seemed to come and go on their own.  Her itching has been improved with the hydroxyzine, she is taking this twice a day.  Tries to stay up on moisturizing her skin.  She has also noticed some red discoloration of her palms and a few starburst like rashes on her arms and chest.  Those have been present for several months. She is very pleased to see that her neuropathy symptoms have improved substantial since starting the [place].   She underwent her vulvectomy surgery and feels she is recovering fairly well. Tough surgery, however and she is ready to be able to get back on her motorcycle. Still taking some over-the-counter and prescription pain medication (oxycodone w/o APAP).   She has essentially removed alcohol from her environment.  She has had 2 non-alcoholic beers 1 time at a bar setting with friends. She is working on smoking cessation and down to 2 packs / week from 7 packs / week.    Review of Systems  Constitutional: Negative for appetite change, fatigue, fever and unexpected weight change.  Respiratory: Negative for shortness of breath.   Cardiovascular: Negative for chest pain and leg  swelling.  Gastrointestinal: Negative for abdominal pain, blood in stool, nausea and vomiting.  Genitourinary: Negative for difficulty urinating and hematuria.  Musculoskeletal: Negative for arthralgias.  Skin: Positive for rash. Negative for color change.       Pruritis  Neurological: Positive for numbness. Negative for dizziness, tremors and headaches.  Hematological: Negative for adenopathy. Does not bruise/bleed easily.  Psychiatric/Behavioral: Negative for confusion.        Past Medical History:  Diagnosis Date  . Anxiety   . Arthritis   . 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 ([place]) 05/2020   followed by ID, Janene Madeira NP,  dx 03/ 2022,  hx IV drug use 1980s  . Cirrhosis of liver ([place]) 05/2020  . Family history of adverse reaction to anesthesia    mother-- hard to wake  . Full dentures   . GERD (gastroesophageal reflux disease)   . Hepatitis B core antibody positive   . Seasonal allergies   . Smokers' cough ([place])   . Vulvar dysplasia   . Wears glasses     Prior to Admission medications   Medication Sig Start Date End Date Taking? Authorizing Provider  buPROPion (WELLBUTRIN SR) 150 MG 12 hr tablet Take 1 tablet (150 mg total) by mouth daily for 3 days, THEN 1 tablet (150 mg total) 2 (two) times daily for 27 days. 05/05/20 06/04/20  Camillia Herter, NP  omeprazole (PRILOSEC) 20 MG capsule Take 1 capsule (20 mg  total) by mouth daily. 05/03/20   Camillia Herter, NP  polyethylene glycol (MIRALAX) 17 g packet Take 17 g by mouth daily. 05/03/20   Camillia Herter, NP    No Known Allergies  Social History   Tobacco Use  . Smoking status: Current Every Day Smoker    Years: 49.00    Types: Cigarettes    Start date: 05/13/1971  . Smokeless tobacco: Never Used  . Tobacco comment: 07-13-2020  pt taking chantix, currently 2ppwk down from 7ppwk  Vaping Use  . Vaping Use: Former  . Quit date: 07/14/2015  Substance Use Topics  . Alcohol use: Not Currently    Comment: due  to the medication for liver disease  . Drug use: Not Currently    Types: Cocaine    Comment: 07-13-2020  pt stated last drug use since  11-12-2005;  hx IV drug use 1980s      Objective:   Vitals:   08/01/20 0907  BP: 134/76  Pulse: 62  Temp: 97.9 F (36.6 C)  SpO2: 98%    Constitutional: in no apparent distress and well developed and well nourished Eyes: anicteric Cardiovascular: Cor RRR and No murmurs Respiratory: clear Gastrointestinal: Bowel sounds are normal, liver is not enlarged, spleen is not enlarged Musculoskeletal: peripheral pulses normal, no pedal edema, no clubbing or cyanosis Skin: she does have several telangiectasias noted on the forearms / chests, palmar erythema noted and a tender erythematous annular appearing area on the lateral right ankle that is non-raised.  Lymphatic: no lymphadenopathy        Laboratory: Genotype:  Lab Results  Component Value Date   HCVGENOTYPE 1a 05/12/2020   HCV viral load: No results found for: HCVQUANT Lab Results  Component Value Date   WBC 4.9 08/01/2020   HGB 15.9 (H) 08/01/2020   HCT 45.9 (H) 08/01/2020   MCV 90.5 08/01/2020   PLT 114 (L) 08/01/2020    Lab Results  Component Value Date   CREATININE 0.71 08/01/2020   BUN 14 08/01/2020   NA 138 08/01/2020   K 4.4 08/01/2020   CL 103 08/01/2020   CO2 27 08/01/2020    Lab Results  Component Value Date   ALT 40 (H) 08/01/2020   AST 36 (H) 08/01/2020   GGT 106 (H) 05/12/2020   ALKPHOS 115 05/03/2020    Lab Results  Component Value Date   INR 1.1 08/01/2020   BILITOT 0.5 08/01/2020   ALBUMIN 4.2 05/03/2020     Imaging:  05-2020:  The liver has a heterogeneous and nodular contour consistent with cirrhosis. No focal liver abnormality. Elastography highly suggestive of compensated liver disease.     Assessment & Plan:   Problem List Items Addressed This Visit      Unprioritized   Skin abnormalities    She has several telangectasias noted on  the skin that are non-painful. Palmar erythema noted today.  External R ankle with more tender patch of erythematous skin - ?Acral necrolytic erythema (though no necrosis) vs porphyria cutanea tarda vs erythema nodosum (though no nodules present).  I would suspect most of these features are related to underlying cirrhosis. Will follow up liver and kidney function tests today.       Pruritus    Improved on hydroxyzine - continue Rx.       Hepatitis C - Primary    Doing well on Epclusa once daily with little side effects. I suspect many of the cutaneous findings are related to underlying cirrhosis  and not the new medication. Will check Hep C RNA level today for therapeutic monitoring      Relevant Orders   Comp Met (CMET) (Completed)   Protime-INR (Completed)   CBC (Completed)   Hepatitis C RNA quantitative ([facility])   Hepatitis B core antibody positive    Either false positive core Ab or previously cleared Hep B infection. Follow up LFTs - second vaccine for Hep B at next visit to allow > 8 weeks between doses. Check Hep B dna and sAg if increased ALT.       Hepatic cirrhosis (HCC)    Cutaneous findings c/w liver disease. Will repeat blood work to reassess CP score today. No findings on exam or history to suggest decompensation.  She does have some mild edema localized to bilateral ankles. Non-pitting.          Janene Madeira, MSN, NP-C [facility] [email or web address] Pager: [phone] Office: [phone] [place]: [phone]

## 2020-08-01 NOTE — Assessment & Plan Note (Signed)
Doing well on Epclusa once daily with little side effects. I suspect many of the cutaneous findings are related to underlying cirrhosis and not the new medication. Will check Hep C RNA level today for therapeutic monitoring

## 2020-08-02 ENCOUNTER — Encounter: Payer: Self-pay | Admitting: Infectious Diseases

## 2020-08-02 DIAGNOSIS — K7460 Unspecified cirrhosis of liver: Secondary | ICD-10-CM | POA: Insufficient documentation

## 2020-08-02 NOTE — Assessment & Plan Note (Signed)
Cutaneous findings c/w liver disease. Will repeat blood work to reassess CP score today. No findings on exam or history to suggest decompensation.  She does have some mild edema localized to bilateral ankles. Non-pitting.

## 2020-08-02 NOTE — Progress Notes (Signed)
Improved LFTs. Stable thrombocytopenia.  With elevated hgb will check iron levels in consideration for hemochromatosis rule out given residual inflammation noted.  Most likely due to smoking however.

## 2020-08-02 NOTE — Assessment & Plan Note (Signed)
Either false positive core Ab or previously cleared Hep B infection. Follow up LFTs - second vaccine for Hep B at next visit to allow > 8 weeks between doses. Check Hep B dna and sAg if increased ALT.

## 2020-08-03 LAB — CBC
HCT: 45.9 % — ABNORMAL HIGH (ref 35.0–45.0)
Hemoglobin: 15.9 g/dL — ABNORMAL HIGH (ref 11.7–15.5)
MCH: 31.4 pg (ref 27.0–33.0)
MCHC: 34.6 g/dL (ref 32.0–36.0)
MCV: 90.5 fL (ref 80.0–100.0)
MPV: 10.7 fL (ref 7.5–12.5)
Platelets: 114 10*3/uL — ABNORMAL LOW (ref 140–400)
RBC: 5.07 10*6/uL (ref 3.80–5.10)
RDW: 12.7 % (ref 11.0–15.0)
WBC: 4.9 10*3/uL (ref 3.8–10.8)

## 2020-08-03 LAB — COMPREHENSIVE METABOLIC PANEL
AG Ratio: 1.2 (calc) (ref 1.0–2.5)
ALT: 40 U/L — ABNORMAL HIGH (ref 6–29)
AST: 36 U/L — ABNORMAL HIGH (ref 10–35)
Albumin: 4.2 g/dL (ref 3.6–5.1)
Alkaline phosphatase (APISO): 86 U/L (ref 37–153)
BUN: 14 mg/dL (ref 7–25)
CO2: 27 mmol/L (ref 20–32)
Calcium: 9.7 mg/dL (ref 8.6–10.4)
Chloride: 103 mmol/L (ref 98–110)
Creat: 0.71 mg/dL (ref 0.50–0.99)
Globulin: 3.6 g/dL (calc) (ref 1.9–3.7)
Glucose, Bld: 105 mg/dL — ABNORMAL HIGH (ref 65–99)
Potassium: 4.4 mmol/L (ref 3.5–5.3)
Sodium: 138 mmol/L (ref 135–146)
Total Bilirubin: 0.5 mg/dL (ref 0.2–1.2)
Total Protein: 7.8 g/dL (ref 6.1–8.1)

## 2020-08-03 LAB — HEPATITIS C RNA QUANTITATIVE
HCV Quantitative Log: <1.18 log IU/mL
HCV RNA, PCR, QN: <15 IU/mL

## 2020-08-03 LAB — PROTIME-INR
INR: 1.1
Prothrombin Time: 11.4 s (ref 9.0–11.5)

## 2020-08-07 MED ORDER — FUROSEMIDE 20 MG PO TABS: 20.0000 mg | ORAL_TABLET | ORAL | 0 refills | Status: DC

## 2020-08-07 MED ORDER — POTASSIUM CHLORIDE CRYS ER 20 MEQ PO TBCR: 20.0000 meq | EXTENDED_RELEASE_TABLET | ORAL | 0 refills | Status: DC

## 2020-08-07 MED ORDER — TRIAMCINOLONE ACETONIDE 0.5 % EX OINT: 1.0000 "application " | TOPICAL_OINTMENT | Freq: Two times a day (BID) | CUTANEOUS | 0 refills | Status: DC

## 2020-08-07 MED ORDER — ZINC 220 (50 ZN) MG PO CAPS: 1.0000 | ORAL_CAPSULE | Freq: Two times a day (BID) | ORAL | 2 refills | Status: AC

## 2020-08-24 ENCOUNTER — Encounter: Payer: Self-pay | Admitting: Gynecologic Oncology
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
## 2020-08-25 ENCOUNTER — Inpatient Hospital Stay: Payer: 59 | Admitting: Gynecologic Oncology

## 2020-08-25 ENCOUNTER — Telehealth: Payer: Self-pay

## 2020-08-25 ENCOUNTER — Encounter: Payer: Self-pay | Admitting: Gynecologic Oncology

## 2020-08-25 NOTE — Telephone Encounter (Signed)
Spoke with Laura Sloan regarding her Appointment with Dr. Berline Lopes today. Unfortunately we need to reschedule. Her appointment has been moved to Tuesday 08/29/20 at 1:30 pm. Patient verbalizes understanding and agrees to new date and time.

## 2020-08-28 ENCOUNTER — Other Ambulatory Visit: Payer: Self-pay | Admitting: Family

## 2020-08-28 DIAGNOSIS — Z716 Tobacco abuse counseling: Secondary | ICD-10-CM
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
## 2020-08-29 ENCOUNTER — Other Ambulatory Visit: Payer: Self-pay | Admitting: Infectious Diseases

## 2020-08-29 ENCOUNTER — Other Ambulatory Visit: Payer: Self-pay

## 2020-08-29 ENCOUNTER — Inpatient Hospital Stay: Payer: 59 | Attending: Gynecologic Oncology | Admitting: Gynecologic Oncology

## 2020-08-29 VITALS — BP 114/78 | HR 77 | Temp 97.6°F | Resp 18 | Ht 66.0 in | Wt 150.0 lb

## 2020-08-29 DIAGNOSIS — D071 Carcinoma in situ of vulva: Secondary | ICD-10-CM

## 2020-08-29 DIAGNOSIS — Z9889 Other specified postprocedural states: Secondary | ICD-10-CM

## 2020-08-29 NOTE — Telephone Encounter (Signed)
Okay to refill or would you like for PCP to take over? Thanks!

## 2020-08-29 NOTE — Patient Instructions (Signed)
You are healing very well from surgery!  You can start riding her motorcycle and getting in the pool as you feel comfortable.  There is a very small part of the incision that is just a little bit open that should heal completely within the next 1-2 weeks.  As we discussed, given the risk of recurrence of precancer, I will send your gynecologist my recommendations that you have a visit and exam every 6 months for 5 years and then yearly thereafter.  It is important though that you are monitoring for any new visible areas or symptoms and to let your gynecologist know if that occurs between visits.

## 2020-08-31 ENCOUNTER — Other Ambulatory Visit: Payer: Self-pay

## 2020-08-31 DIAGNOSIS — Z716 Tobacco abuse counseling: Secondary | ICD-10-CM

## 2020-09-13 ENCOUNTER — Other Ambulatory Visit: Payer: Self-pay | Admitting: Family

## 2020-09-13 DIAGNOSIS — K219 Gastro-esophageal reflux disease without esophagitis: Secondary | ICD-10-CM

## 2020-09-16 ENCOUNTER — Other Ambulatory Visit: Payer: Self-pay | Admitting: Family

## 2020-09-22 ENCOUNTER — Other Ambulatory Visit: Payer: Self-pay | Admitting: Infectious Diseases

## 2020-09-22 ENCOUNTER — Other Ambulatory Visit: Payer: Self-pay

## 2020-09-22 DIAGNOSIS — E876 Hypokalemia: Secondary | ICD-10-CM

## 2020-09-22 DIAGNOSIS — R601 Generalized edema: Secondary | ICD-10-CM

## 2020-09-22 MED ORDER — FUROSEMIDE 20 MG PO TABS: 20.0000 mg | ORAL_TABLET | ORAL | 0 refills | Status: DC

## 2020-09-22 MED ORDER — POTASSIUM CHLORIDE CRYS ER 20 MEQ PO TBCR: EXTENDED_RELEASE_TABLET | ORAL | 0 refills | Status: DC

## 2020-09-22 NOTE — Progress Notes (Signed)
Patient Name: Laura Sloan  Date of Birth: [birth year]  MRN: [id number]  PCP: Camillia Herter, NP  Referring Provider: Camillia Herter, NP, Ph#: [phone]   CC:  Hepatitis C follow up - end of treatment     HPI:  Laura Sloan is a 65 y.o. female with chronic hepatitis C infection due to genotype 1a. Fibrotest and Elastography indicate cirrhosis/F4; Child Pugh A, compensated disease.   [name] Start: 06/20/2020 Epclusa End: 09/11/2020  Hep C RNAs:  05/12/2020 - 5,020,000 08/01/2020 - not detected; LFTs improved.    Takes lasix/kcl maybe up to 3 times in 7d. Swelling is always worse for her over the summer but she has never had it present this much in her legs. She is planning to have a beer this week to see how it sits with her. Has not had any alcohol otherwise since March. Has had a problem with return of headaches since stopping Epclusa; ironically she had none when she was taking this. Started smoking more often unfortunately and up to 1ppd nearly. Taking Chantix prescribed by her PCP but only doing 1 mg daily not BID. 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 Feels like stress of her health had impacted her frequency/desire to smoke.   She still has painful red rashes to lower extremities. Uses topical steroid cream that helps. Also applying these to upper arm/chest rashes.   Getting her 2nd shingles shot today.    Review of Systems  Constitutional:  Negative for appetite change, fatigue, fever and unexpected weight change.  Respiratory:  Negative for shortness of breath.   Cardiovascular:  Negative for chest pain and leg swelling.  Gastrointestinal:  Negative for abdominal pain, blood in stool, nausea and vomiting.  Genitourinary:  Negative for difficulty urinating and hematuria.  Musculoskeletal:  Negative for arthralgias.  Skin:  Positive for rash (painful lower leg rashes). Negative for color change.  Neurological:  Positive for headaches.  Negative for dizziness, tremors and numbness.  Hematological:  Negative for adenopathy. Does not bruise/bleed easily.  Psychiatric/Behavioral:  Negative for confusion.        Past Medical History:  Diagnosis Date   Anxiety    Arthritis    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 ([place]) 05/2020   followed by ID, Janene Madeira NP,  dx 03/ 2022,  hx IV drug use 1980s   Cirrhosis of liver ([place]) 05/2020   Family history of adverse reaction to anesthesia    mother-- hard to wake   Full dentures    GERD (gastroesophageal reflux disease)    Hepatitis B core antibody positive    Seasonal allergies    Smokers' cough ([place])    Vulvar dysplasia    Wears glasses     Prior to Admission medications   Medication Sig Start Date End Date Taking? Authorizing Provider  buPROPion (WELLBUTRIN SR) 150 MG 12 hr tablet Take 1 tablet (150 mg total) by mouth daily for 3 days, THEN 1 tablet (150 mg total) 2 (two) times daily for 27 days. 05/05/20 06/04/20  Camillia Herter, NP  omeprazole (PRILOSEC) 20 MG capsule Take 1 capsule (20 mg total) by mouth daily. 05/03/20   Camillia Herter, NP  polyethylene glycol (MIRALAX) 17 g packet Take 17 g by mouth daily. 05/03/20   Camillia Herter, NP    No Known Allergies  Social History   Tobacco Use   Smoking status: Every Day    Years:  49.00    Types: Cigarettes    Start date: 05/13/1971   Smokeless tobacco: Never   Tobacco comments:    07-13-2020  pt taking chantix, currently 2ppwk down from 7ppwk  Vaping Use   Vaping Use: Former   Quit date: 07/14/2015  Substance Use Topics   Alcohol use: Not Currently    Comment: due to the medication for liver disease   Drug use: Not Currently    Types: Cocaine    Comment: 07-13-2020  pt stated last drug use since  11-12-2005;  hx IV drug use 1980s      Objective:   Vitals:   09/25/20 0911  BP: 97/65  Pulse: 60  Temp: 98 F (36.7 C)   Body mass index is 24.24 kg/m.   Constitutional: in no apparent  distress and well developed and well nourished Eyes: anicteric Cardiovascular: Cor RRR and No murmurs Respiratory: clear Gastrointestinal: Bowel sounds are normal, liver is not enlarged, spleen is not enlarged Musculoskeletal: peripheral pulses normal, trace to 1+ pedal edema limited to ankles, no clubbing or cyanosis Skin: several telangiectasias noted on the forearms / chests, palmar erythema noted. Tender erythematous annular appearing areas overlying bilateral legs. Previous spot over R ankle has resolved.  Lymphatic: no lymphadenopathy    Laboratory: Genotype:  Lab Results  Component Value Date   HCVGENOTYPE 1a 05/12/2020   HCV viral load: No results found for: HCVQUANT Lab Results  Component Value Date   WBC 4.9 08/01/2020   HGB 15.9 (H) 08/01/2020   HCT 45.9 (H) 08/01/2020   MCV 90.5 08/01/2020   PLT 114 (L) 08/01/2020    Lab Results  Component Value Date   CREATININE 0.71 08/01/2020   BUN 14 08/01/2020   NA 138 08/01/2020   K 4.4 08/01/2020   CL 103 08/01/2020   CO2 27 08/01/2020    Lab Results  Component Value Date   ALT 40 (H) 08/01/2020   AST 36 (H) 08/01/2020   GGT 106 (H) 05/12/2020   ALKPHOS 115 05/03/2020    Lab Results  Component Value Date   INR 1.1 08/01/2020   BILITOT 0.5 08/01/2020   ALBUMIN 4.2 05/03/2020     Imaging:  05-2020:  The liver has a heterogeneous and nodular contour consistent with cirrhosis. No focal liver abnormality. Elastography highly suggestive of compensated liver disease. (kPcal >14).     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 Will continue topical steroids for her since it seems to help.  Controlling edema should help.  Next step would be to refer to dermatology for consideration of biopsy.        Pruritus    Can  continue hydroxyzine as needed. Seems better        Hepatitis C - Primary    Good response on Epclusa with viral load not detected. Will check end of treatment response today and have her back in 110mfor SVR cure visit.  Re[facility] [place] liver [facility] - she will need ongoing Q646mcreening lifelong.  2nd hep B vaccine today. She would like pneumonia vaccine at next visit.        Relevant Orders   Hepatitis C RNA quantitative ([facility])   COMPLETE METABOLIC PANEL WITH GFR   Hepatic cirrhosis (HCRio  MELD score 3mago calculated to be 7.  Given lower extremity edema with varicose veins in place and higher kilopascal reading on elastography (more concerning for portal htn) will refer to GI for assistance with cirrhosis management / EGD screening.  She will continue PRN lasix as needed. Compression stockings may be helpful but hard to wear in summer months.  Limit tylenol to 1g max daily with cirrhosis.  Still with some elevated LFTs but improved a good amount with treating Hepatitis C virus. Not drinking currently and recommended to continue to abstain. Will follow up iron studies to assess hemochromatosis (elevated Hgb on blood work).  ?other causes, GI to help investigate other considerations - appreciate help.        Relevant Orders   Iron, Total/Total Iron Binding Cap   Ferritin   Ambulatory referral to Gastroenterology   [place]DOM[place] LIMITED RUQ (LIVER/GB)   Elevated glucose    She would like some reassurance she is not diabetic - A1c today to follow up on elevated CBG from last lab (not-fasting)  - very low likelihood of diabetes.        Relevant Orders   Hemoglobin A1c   Other Visit Diagnoses     Need for hepatitis B booster vaccination       Relevant Orders   Heplisav-B (HepB-CPG) Vaccine (Completed)      SJanene Madeira MSN, NP-C RGolf Manorfor Infectious Disease [email or web address] Pager: [phone]:  [address] [phone]

## 2020-09-25 ENCOUNTER — Other Ambulatory Visit: Payer: Self-pay

## 2020-09-25 ENCOUNTER — Ambulatory Visit (INDEPENDENT_AMBULATORY_CARE_PROVIDER_SITE_OTHER): Payer: 59 | Admitting: Infectious Diseases

## 2020-09-25 ENCOUNTER — Other Ambulatory Visit: Payer: Self-pay | Admitting: Infectious Diseases

## 2020-09-25 ENCOUNTER — Encounter: Payer: Self-pay | Admitting: Infectious Diseases

## 2020-09-25 VITALS — BP 97/65 | HR 60 | Temp 98.0°F | Wt 150.2 lb

## 2020-09-25 DIAGNOSIS — Z23 Encounter for immunization: Secondary | ICD-10-CM | POA: Diagnosis not present

## 2020-09-25 DIAGNOSIS — L299 Pruritus, unspecified: Secondary | ICD-10-CM | POA: Diagnosis not present

## 2020-09-25 DIAGNOSIS — K7469 Other cirrhosis of liver: Secondary | ICD-10-CM

## 2020-09-25 DIAGNOSIS — L989 Disorder of the skin and subcutaneous tissue, unspecified: Secondary | ICD-10-CM

## 2020-09-25 DIAGNOSIS — R7309 Other abnormal glucose: Secondary | ICD-10-CM | POA: Diagnosis not present

## 2020-09-25 DIAGNOSIS — B1920 Unspecified viral hepatitis C without hepatic coma: Secondary | ICD-10-CM | POA: Diagnosis not present

## 2020-09-25 NOTE — Assessment & Plan Note (Signed)
Can continue hydroxyzine as needed. Seems better

## 2020-09-25 NOTE — Assessment & Plan Note (Addendum)
She would like some reassurance she is not diabetic - A1c today to follow up on elevated CBG from last lab (not-fasting)  - very low likelihood of diabetes.

## 2020-09-25 NOTE — Assessment & Plan Note (Signed)
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 Will continue topical steroids for her since it seems to help.  Controlling edema should help.  Next step would be to refer to dermatology for consideration of biopsy.

## 2020-09-25 NOTE — Patient Instructions (Addendum)
Nice to see you!  Would like for you to see your regular doc about the swelling in your legs.   For headaches - I would start with one tylenol tablet to see if that helps - you can have up to 1 gram in a 24 hour period). I worry about adding the ibuprofen for you since your platelets are low.   I am going to refer you to GI also and get you set back up for a liver ultrasound to be repeated bore your visit back.

## 2020-09-25 NOTE — Assessment & Plan Note (Signed)
Good response on Epclusa with viral load not detected. Will check end of treatment response today and have her back in 95mfor SVR cure visit.  Repeat [place] liver [facility] - she will need ongoing Q662mcreening lifelong.  2nd hep B vaccine today. She would like pneumonia vaccine at next visit.

## 2020-09-25 NOTE — Assessment & Plan Note (Addendum)
MELD score 49mago calculated to be 7.  Given lower extremity edema with varicose veins in place and higher kilopascal reading on elastography (more concerning for portal htn) will refer to GI for assistance with cirrhosis management / EGD screening.  She will continue PRN lasix as needed. Compression stockings may be helpful but hard to wear in summer months.  Limit tylenol to 1g max daily with cirrhosis.  Still with some elevated LFTs but improved a good amount with treating Hepatitis C virus. Not drinking currently and recommended to continue to abstain. Will follow up iron studies to assess hemochromatosis (elevated Hgb on blood work).  ?other causes, GI to help investigate other considerations - appreciate help.

## 2020-09-28 LAB — COMPLETE METABOLIC PANEL WITH GFR
AG Ratio: 1.2 (calc) (ref 1.0–2.5)
ALT: 49 U/L — ABNORMAL HIGH (ref 6–29)
AST: 44 U/L — ABNORMAL HIGH (ref 10–35)
Albumin: 4.5 g/dL (ref 3.6–5.1)
Alkaline phosphatase (APISO): 112 U/L (ref 37–153)
BUN: 18 mg/dL (ref 7–25)
CO2: 27 mmol/L (ref 20–32)
Calcium: 10.2 mg/dL (ref 8.6–10.4)
Chloride: 102 mmol/L (ref 98–110)
Creat: 0.78 mg/dL (ref 0.50–1.05)
Globulin: 3.9 g/dL (calc) — ABNORMAL HIGH (ref 1.9–3.7)
Glucose, Bld: 99 mg/dL (ref 65–99)
Potassium: 4.7 mmol/L (ref 3.5–5.3)
Sodium: 138 mmol/L (ref 135–146)
Total Bilirubin: 0.9 mg/dL (ref 0.2–1.2)
Total Protein: 8.4 g/dL — ABNORMAL HIGH (ref 6.1–8.1)
eGFR: 86 mL/min/{1.73_m2} (ref >=60)

## 2020-09-28 LAB — HEPATITIS C RNA QUANTITATIVE
HCV Quantitative Log: <1.18 log IU/mL
HCV RNA, PCR, QN: <15 IU/mL

## 2020-09-28 LAB — HEMOGLOBIN A1C
Hgb A1c MFr Bld: 5.4 % of total Hgb (ref <5.7)
Mean Plasma Glucose: 108 mg/dL
eAG (mmol/L): 6 mmol/L

## 2020-09-28 LAB — IRON, TOTAL/TOTAL IRON BINDING CAP
%SAT: 37 % (calc) (ref 16–45)
Iron: 157 ug/dL (ref 45–160)
TIBC: 421 mcg/dL (calc) (ref 250–450)

## 2020-09-28 LAB — FERRITIN: Ferritin: 139 ng/mL (ref 16–288)

## 2020-10-02 ENCOUNTER — Other Ambulatory Visit: Payer: Self-pay

## 2020-10-02 ENCOUNTER — Ambulatory Visit (HOSPITAL_COMMUNITY)
Admission: RE | Admit: 2020-10-02 | Discharge: 2020-10-02 | Disposition: A | Discharge status: Home / Self Care | Payer: 59 | Source: Ambulatory Visit | Attending: Infectious Diseases | Admitting: Infectious Diseases

## 2020-10-02 DIAGNOSIS — K7469 Other cirrhosis of liver: Secondary | ICD-10-CM | POA: Diagnosis present

## 2020-10-10 ENCOUNTER — Other Ambulatory Visit: Payer: Self-pay

## 2020-10-10 ENCOUNTER — Ambulatory Visit: Payer: 59

## 2020-10-17 ENCOUNTER — Other Ambulatory Visit: Payer: Self-pay | Admitting: Family

## 2020-10-17 DIAGNOSIS — R601 Generalized edema: Secondary | ICD-10-CM

## 2020-10-17 DIAGNOSIS — E876 Hypokalemia: Secondary | ICD-10-CM

## 2020-11-14 ENCOUNTER — Ambulatory Visit: Payer: 59 | Admitting: Family
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
## 2020-11-21 ENCOUNTER — Ambulatory Visit (INDEPENDENT_AMBULATORY_CARE_PROVIDER_SITE_OTHER): Payer: 59 | Admitting: Family

## 2020-11-21 ENCOUNTER — Other Ambulatory Visit: Payer: Self-pay

## 2020-11-21 VITALS — BP 132/88 | HR 68 | Temp 97.9°F | Resp 18 | Ht 65.98 in | Wt 145.4 lb

## 2020-11-21 DIAGNOSIS — Z13220 Encounter for screening for lipoid disorders: Secondary | ICD-10-CM | POA: Diagnosis not present

## 2020-11-21 DIAGNOSIS — I8393 Asymptomatic varicose veins of bilateral lower extremities: Secondary | ICD-10-CM

## 2020-11-21 DIAGNOSIS — Z716 Tobacco abuse counseling: Secondary | ICD-10-CM

## 2020-11-21 DIAGNOSIS — R519 Headache, unspecified: Secondary | ICD-10-CM | POA: Diagnosis not present

## 2020-11-21 DIAGNOSIS — R220 Localized swelling, mass and lump, head: Secondary | ICD-10-CM

## 2020-11-21 MED ORDER — SUMATRIPTAN SUCCINATE 25 MG PO TABS: ORAL_TABLET | ORAL | 0 refills | Status: DC

## 2020-11-21 MED ORDER — VARENICLINE TARTRATE 1 MG PO TABS: 1.0000 mg | ORAL_TABLET | Freq: Every day | ORAL | 1 refills | Status: DC

## 2020-11-21 NOTE — Progress Notes (Signed)
Pt presents for cholesterol check up, pt reports she would like to start back on Chantix, she will be completed Hep C program by 12/26/20 Pt states that she needs something to prevent headaches before she gets them

## 2020-11-22 LAB — LIPID PANEL
Chol/HDL Ratio: 4.1 ratio (ref 0.0–4.4)
Cholesterol, Total: 222 mg/dL — ABNORMAL HIGH (ref 100–199)
HDL: 54 mg/dL (ref >39)
LDL Chol Calc (NIH): 150 mg/dL — ABNORMAL HIGH (ref 0–99)
Triglycerides: 101 mg/dL (ref 0–149)
VLDL Cholesterol Cal: 18 mg/dL (ref 5–40)

## 2020-11-22 NOTE — Progress Notes (Signed)
Cholesterol higher than expected. High cholesterol may increase risk of heart attack and/or stroke. Consider eating more fruits, vegetables, and lean baked meats such as chicken or fish. Moderate intensity exercise at least 150 minutes as tolerated per week may help as well. However, your risk of heart attack/stroke in ten years is low so does not need to start a medication at the moment. Encouraged to recheck in 3 to 6 months.

## 2020-12-05 ENCOUNTER — Encounter: Payer: Self-pay | Admitting: Neurology

## 2020-12-05 ENCOUNTER — Ambulatory Visit: Payer: 59 | Admitting: Neurology

## 2020-12-05 ENCOUNTER — Other Ambulatory Visit: Payer: Self-pay

## 2020-12-05 VITALS — BP 123/79 | HR 60 | Ht 66.0 in | Wt 153.4 lb

## 2020-12-05 DIAGNOSIS — G629 Polyneuropathy, unspecified: Secondary | ICD-10-CM | POA: Diagnosis not present

## 2020-12-05 MED ORDER — GABAPENTIN 300 MG PO CAPS: 300.0000 mg | ORAL_CAPSULE | Freq: Every day | ORAL | 11 refills | Status: DC
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
## 2020-12-05 NOTE — Patient Instructions (Addendum)
Start Gabapentin at bedtime Consider emg/ncs Blood work   Peripheral Neuropathy Peripheral neuropathy is a type of nerve damage. It affects nerves that carry signals between the spinal cord and the arms, legs, and the rest of the body (peripheral nerves). It does not affect nerves in the spinal cord or brain. In peripheral neuropathy, one nerve or a group of nerves may be damaged. Peripheral neuropathy is a broad category that includes many specific nerve disorders, like diabetic neuropathy, hereditary neuropathy, and carpal tunnel syndrome. What are the causes? This condition may be caused by: Diabetes. This is the most common cause of peripheral neuropathy. Nerve injury. Pressure or stress on a nerve that lasts a long time. Lack (deficiency) of B vitamins. This can result from alcoholism, poor diet, or a restricted diet. Infections. Autoimmune diseases, such as rheumatoid arthritis and systemic lupus erythematosus. Nerve diseases that are passed from parent to child (inherited). Some medicines, such as cancer medicines (chemotherapy). Poisonous (toxic) substances, such as lead and mercury. Too little blood flowing to the legs. Kidney disease. Thyroid disease. In some cases, the cause of this condition is not known. What are the signs or symptoms? Symptoms of this condition depend on which of your nerves is damaged. Common symptoms include: Loss of feeling (numbness) in the feet, hands, or both. Tingling in the feet, hands, or both. Burning pain. Very sensitive skin. Weakness. Not being able to move a part of the body (paralysis). Muscle twitching. Clumsiness or poor coordination. Loss of balance. Not being able to control your bladder. Feeling dizzy. Sexual problems. How is this diagnosed? Diagnosing and finding the cause of peripheral neuropathy can be difficult. Your health care provider will take your medical history and do a physical exam. A neurological exam will also be  done. This involves checking things that are affected by your brain, spinal cord, and nerves (nervous system). For example, your health care provider will check your reflexes, how you move, and what you can feel. You may have other tests, such as: Blood tests. Electromyogram (EMG) and nerve conduction tests. These tests check nerve function and how well the nerves are controlling the muscles. Imaging tests, such as CT scans or MRI to rule out other causes of your symptoms. Removing a small piece of nerve to be examined in a lab (nerve biopsy). Removing and examining a small amount of the fluid that surrounds the brain and spinal cord (lumbar puncture). How is this treated? Treatment for this condition may involve: Treating the underlying cause of the neuropathy, such as diabetes, kidney disease, or vitamin deficiencies. Stopping medicines that can cause neuropathy, such as chemotherapy. Medicine to help relieve pain. Medicines may include: Prescription or over-the-counter pain medicine. Antiseizure medicine. Antidepressants. Pain-relieving patches that are applied to painful areas of skin. Surgery to relieve pressure on a nerve or to destroy a nerve that is causing pain. Physical therapy to help improve movement and balance. Devices to help you move around (assistive devices). Follow these instructions at home: Medicines Take over-the-counter and prescription medicines only as told by your health care provider. Do not take any other medicines without first asking your health care provider. Do not drive or use heavy machinery while taking prescription pain medicine. Lifestyle  Do not use any products that contain nicotine or tobacco, such as cigarettes and e-cigarettes. Smoking keeps blood from reaching damaged nerves. If you need help quitting, ask your health care provider. Avoid or limit alcohol. Too much alcohol can cause a vitamin B deficiency,  and vitamin B is needed for healthy  nerves. Eat a healthy diet. This includes: Eating foods that are high in fiber, such as fresh fruits and vegetables, whole grains, and beans. Limiting foods that are high in fat and processed sugars, such as fried or sweet foods. General instructions  If you have diabetes, work closely with your health care provider to keep your blood sugar under control. If you have numbness in your feet: Check every day for signs of injury or infection. Watch for redness, warmth, and swelling. Wear padded socks and comfortable shoes. These help protect your feet. Develop a good support system. Living with peripheral neuropathy can be stressful. Consider talking with a mental health specialist or joining a support group. Use assistive devices and attend physical therapy as told by your health care provider. This may include using a walker or a cane. Keep all follow-up visits as told by your health care provider. This is important. Contact a health care provider if: You have new signs or symptoms of peripheral neuropathy. You are struggling emotionally from dealing with peripheral neuropathy. Your pain is not well-controlled. Get help right away if: You have an injury or infection that is not healing normally. You develop new weakness in an arm or leg. You have fallen or do so frequently. Summary Peripheral neuropathy is when the nerves in the arms, or legs are damaged, resulting in numbness, weakness, or pain. There are many causes of peripheral neuropathy, including diabetes, pinched nerves, vitamin deficiencies, autoimmune disease, and hereditary conditions. Diagnosing and finding the cause of peripheral neuropathy can be difficult. Your health care provider will take your medical history, do a physical exam, and do tests, including blood tests and nerve function tests. Treatment involves treating the underlying cause of the neuropathy and taking medicines to help control pain. Physical therapy and  assistive devices may also help. This information is not intended to replace advice given to you by your health care provider. Make sure you discuss any questions you have with your health care provider. Document Revised: 11/30/2019 Document Reviewed: 11/30/2019 Elsevier Patient Education  2022 [place] is a test to check how well your muscles and nerves are working. This procedure includes the combined use of electromyogram (EMG) and nerve conduction study (NCS). EMG is used to look for muscular disorders. NCS, which is also called electroneurogram, measures how well your nerves are controlling your muscles. The procedures are usually done together to check if your muscles and nerves are healthy. If the results of the tests are abnormal, this may indicate disease or injury, such as a neuromuscular disease or peripheral nerve damage. Tell a health care provider about: Any allergies you have. All medicines you are taking, including vitamins, herbs, eye drops, creams, and over-the-counter medicines. Any problems you or family members have had with anesthetic medicines. Any blood disorders you have. Any surgeries you have had. Any medical conditions you have. If you have a pacemaker. Whether you are pregnant or may be pregnant. What are the risks? Generally, this is a safe procedure. However, problems may occur, including: Infection where the electrodes were inserted. Bleeding. What happens before the procedure? Medicines Ask your health care provider about: Changing or stopping your regular medicines. This is especially important if you are taking diabetes medicines or blood thinners. Taking medicines such as aspirin and ibuprofen. These medicines can thin your blood. Do not take these medicines unless your health care provider tells you to take them. Taking  over-the-counter medicines, vitamins, herbs, and supplements. General  instructions Your health care provider may ask you to avoid: Beverages that have caffeine, such as coffee and tea. Any products that contain nicotine or tobacco. These products include cigarettes, e-cigarettes, and chewing tobacco. If you need help quitting, ask your health care provider. Do not use lotions or creams on the same day that you will be having the procedure. What happens during the procedure? For EMG  Your health care provider will ask you to stay in a position so that he or she can access the muscle that will be studied. You may be standing, sitting, or lying down. You may be given a medicine that numbs the area (local anesthetic). A very thin needle that has an electrode will be inserted into your muscle. Another small electrode will be placed on your skin near the muscle. Your health care provider will ask you to continue to remain still. The electrodes will send a signal that tells about the electrical activity of your muscles. You may see this on a monitor or hear it in the room. After your muscles have been studied at rest, your health care provider will ask you to contract or flex your muscles. The electrodes will send a signal that tells about the electrical activity of your muscles. Your health care provider will remove the electrodes and the electrode needles when the procedure is finished. The procedure may vary among health care providers and hospitals. For NCS  An electrode that records your nerve activity (recording electrode) will be placed on your skin by the muscle that is being studied. An electrode that is used as a reference (reference electrode) will be placed near the recording electrode. A paste or gel will be applied to your skin between the recording electrode and the reference electrode. Your nerve will be stimulated with a mild shock. Your health care provider will measure how much time it takes for your muscle to react. Your health care provider will  remove the electrodes and the gel when the procedure is finished. The procedure may vary among health care providers and hospitals. What happens after the procedure? It is up to you to get the results of your procedure. Ask your health care provider, or the department that is doing the procedure, when your results will be ready. Your health care provider may: Give you medicines for any pain. Monitor the insertion sites to make sure that bleeding stops. Summary Electromyoneurogram is a test to check how well your muscles and nerves are working. If the results of the tests are abnormal, this may indicate disease or injury. This is a safe procedure. However, problems may occur, such as bleeding and infection. Your health care provider will do two tests to complete this procedure. One checks your muscles (EMG) and another checks your nerves (NCS). It is up to you to get the results of your procedure. Ask your health care provider, or the department that is doing the procedure, when your results will be ready. This information is not intended to replace advice given to you by your health care provider. Make sure you discuss any questions you have with your health care provider. Document Revised: 11/04/2017 Document Reviewed: 10/17/2017 Elsevier Patient Education  2022 [place] Syndrome Carpal tunnel syndrome is a condition that causes pain, numbness, and weakness in your hand and fingers. The carpal tunnel is a narrow area located on the palm side of your wrist. Repeated wrist motion or certain  diseases may cause swelling within the tunnel. This swelling pinches the main nerve in the wrist. The main nerve in the wrist is called the median nerve. What are the causes? This condition may be caused by: Repeated and forceful wrist and hand motions. Wrist injuries. Arthritis. A cyst or tumor in the carpal tunnel. Fluid buildup during pregnancy. Use of tools that  vibrate. Sometimes the cause of this condition is not known. What increases the risk? The following factors may make you more likely to develop this condition: Having a job that requires you to repeatedly or forcefully move your wrist or hand or requires you to use tools that vibrate. This may include jobs that involve using computers, working on an [employer], or working with [place] such as Pension scheme manager. Being a woman. Having certain conditions, such as: Diabetes. Obesity. An underactive thyroid (hypothyroidism). Kidney failure. Rheumatoid arthritis. What are the signs or symptoms? Symptoms of this condition include: A tingling feeling in your fingers, especially in your thumb, index, and middle fingers. Tingling or numbness in your hand. An aching feeling in your entire arm, especially when your wrist and elbow are bent for a long time. Wrist pain that goes up your arm to your shoulder. Pain that goes down into your palm or fingers. A weak feeling in your hands. You may have trouble grabbing and holding items. Your symptoms may feel worse during the night. How is this diagnosed? This condition is diagnosed with a medical history and physical exam. You may also have tests, including: Electromyogram (EMG). This test measures electrical signals sent by your nerves into the muscles. Nerve conduction study. This test measures how well electrical signals pass through your nerves. Imaging tests, such as X-rays, ultrasound, and MRI. These tests check for possible causes of your condition. How is this treated? This condition may be treated with: Lifestyle changes. It is important to stop or change the activity that caused your condition. Doing exercise and activities to strengthen and stretch your muscles and tendons (physical therapy). Making lifestyle changes to help with your condition and learning how to do your daily activities safely (occupational therapy). Medicines for  pain and inflammation. This may include medicine that is injected into your wrist. A wrist splint or brace. Surgery. Follow these instructions at home: If you have a splint or brace: Wear the splint or brace as told by your health care provider. Remove it only as told by your health care provider. Loosen the splint or brace if your fingers tingle, become numb, or turn cold and blue. Keep the splint or brace clean. If the splint or brace is not waterproof: Do not let it get wet. Cover it with a watertight covering when you take a bath or shower. Managing pain, stiffness, and swelling If directed, put ice on the painful area. To do this: If you have a removeable splint or brace, remove it as told by your health care provider. Put ice in a plastic bag. Place a towel between your skin and the bag or between the splint or brace and the bag. Leave the ice on for 20 minutes, 2-3 times a day. Do not fall asleep with the cold pack on your skin. Remove the ice if your skin turns bright red. This is very important. If you cannot feel pain, heat, or cold, you have a greater risk of damage to the area. Move your fingers often to reduce stiffness and swelling. General instructions Take over-the-counter and prescription medicines  only as told by your health care provider. Rest your wrist and hand from any activity that may be causing your pain. If your condition is work related, talk with your employer about changes that can be made, such as getting a wrist pad to use while typing. Do any exercises as told by your health care provider, physical therapist, or occupational therapist. Keep all follow-up visits. This is important. Contact a health care provider if: You have new symptoms. Your pain is not controlled with medicines. Your symptoms get worse. Get help right away if: You have severe numbness or tingling in your wrist or hand. Summary Carpal tunnel syndrome is a condition that causes pain,  numbness, and weakness in your hand and fingers. It is usually caused by repeated wrist motions. Lifestyle changes and medicines are used to treat carpal tunnel syndrome. Surgery may be recommended. Follow your health care provider's instructions about wearing a splint, resting from activity, keeping follow-up visits, and calling for help. This information is not intended to replace advice given to you by your health care provider. Make sure you discuss any questions you have with your health care provider. Document Revised: 07/01/2019 Document Reviewed: 07/01/2019 Elsevier Patient Education  [place].  Gabapentin Capsules or Tablets What is this medication? GABAPENTIN (GA ba pen tin) treats nerve pain. It may also be used to prevent and control seizures in people with epilepsy. It works by calming overactive nerves in your body. This medicine may be used for other purposes; ask your health care provider or pharmacist if you have questions. COMMON BRAND NAME(S): Active-PAC with Gabapentin, Orpha Bur, Gralise, Neurontin What should I tell my care team before I take this medication? They need to know if you have any of these conditions: Alcohol or substance use disorder Kidney disease Lung or breathing disease Suicidal thoughts, plans, or attempt; a previous suicide attempt by you or a family member An unusual or allergic reaction to gabapentin, other medications, foods, dyes, or preservatives Pregnant or trying to get pregnant Breast-feeding How should I use this medication? Take this medication by mouth with a glass of water. Follow the directions on the prescription label. You can take it with or without food. If it upsets your stomach, take it with food. Take your medication at regular intervals. Do not take it more often than directed. Do not stop taking except on your care team's advice. If you are directed to break the 600 or 800 mg tablets in half as part of your dose, the  extra half tablet should be used for the next dose. If you have not used the extra half tablet within 28 days, it should be thrown away. A special MedGuide will be given to you by the pharmacist with each prescription and refill. Be sure to read this information carefully each time. Talk to your care team about the use of this medication in children. While this medication may be prescribed for children as young as 3 years for selected conditions, precautions do apply. Overdosage: If you think you have taken too much of this medicine contact a poison control center or emergency room at once. NOTE: This medicine is only for you. Do not share this medicine with others. What if I miss a dose? If you miss a dose, take it as soon as you can. If it is almost time for your next dose, take only that dose. Do not take double or extra doses. What may interact with this medication? Alcohol Antihistamines for  allergy, cough, and cold Certain medications for anxiety or sleep Certain medications for depression like amitriptyline, fluoxetine, sertraline Certain medications for seizures like phenobarbital, primidone Certain medications for stomach problems General anesthetics like halothane, isoflurane, methoxyflurane, propofol Local anesthetics like lidocaine, pramoxine, tetracaine Medications that relax muscles for surgery Narcotic medications for pain Phenothiazines like chlorpromazine, mesoridazine, prochlorperazine, thioridazine This list may not describe all possible interactions. Give your health care provider a list of all the medicines, herbs, non-prescription drugs, or dietary supplements you use. Also tell them if you smoke, drink alcohol, or use illegal drugs. Some items may interact with your medicine. What should I watch for while using this medication? Visit your care team for regular checks on your progress. You may want to keep a record at home of how you feel your condition is responding to  treatment. You may want to share this information with your care team at each visit. You should contact your care team if your seizures get worse or if you have any new types of seizures. Do not stop taking this medication or any of your seizure medications unless instructed by your care team. Stopping your medication suddenly can increase your seizures or their severity. This medication may cause serious skin reactions. They can happen weeks to months after starting the medication. Contact your care team right away if you notice fevers or flu-like symptoms with a rash. The rash may be red or purple and then turn into blisters or peeling of the skin. Or, you might notice a red rash with swelling of the face, lips or lymph nodes in your neck or under your arms. Wear a medical identification bracelet or chain if you are taking this medication for seizures. Carry a card that lists all your medications. You may get drowsy, dizzy, or have blurred vision. Do not drive, use machinery, or do anything that needs mental alertness until you know how this medication affects you. To reduce dizzy or fainting spells, do not sit or stand up quickly, especially if you are an older patient. Alcohol can increase drowsiness and dizziness. Your mouth may get dry. Chewing sugarless gum or sucking hard candy, and drinking plenty of water may help. Watch for new or worsening thoughts of suicide or depression. This includes sudden changes in mood, behaviors, or thoughts. These changes can happen at any time but are more common in the beginning of treatment or after a change in dose. Call your care team right away if you experience these thoughts or worsening depression. If you become pregnant while using this medication, you may enroll in the [place] Pregnancy Registry by calling [phone]. This registry collects information about the safety of antiepileptic medication use during pregnancy. What side  effects may I notice from receiving this medication? Side effects that you should report to your care team as soon as possible: Allergic reactions or angioedema-skin rash, itching, hives, swelling of the face, eyes, lips, tongue, arms, or legs, trouble swallowing or breathing Rash, fever, and swollen lymph nodes Thoughts of suicide or self harm, worsening mood, feelings of depression Trouble breathing Unusual changes in mood or behavior in children after use such as difficulty concentrating, hostility, or restlessness Side effects that usually do not require medical attention (report to your care team if they continue or are bothersome): Dizziness Drowsiness Nausea Swelling of ankles, feet, or hands Vomiting This list may not describe all possible side effects. Call your doctor for medical advice about side effects. You may report  side effects to FDA at 1-800-FDA-1088. Where should I keep my medication? Keep out of reach of children and pets. Store at room temperature between 15 and 30 degrees C (59 and 86 degrees F). Get rid of any unused medication after the expiration date. This medication may cause accidental overdose and death if taken by other adults, children, or pets. Mix any unused medication with a substance like cat litter or coffee grounds. Then throw the medication away in a sealed container like a sealed bag or a coffee can with a lid. NOTE: This sheet is a summary. It may not cover all possible information. If you have questions about this medicine, talk to your doctor, pharmacist, or health care provider.  2022 Elsevier/Gold Standard (2020-01-05 12:16:18)

## 2020-12-07 ENCOUNTER — Other Ambulatory Visit: Payer: Self-pay | Admitting: Neurology

## 2020-12-07 ENCOUNTER — Other Ambulatory Visit: Payer: Self-pay | Admitting: Infectious Diseases

## 2020-12-07 ENCOUNTER — Other Ambulatory Visit: Payer: Self-pay | Admitting: Family

## 2020-12-07 DIAGNOSIS — E559 Vitamin D deficiency, unspecified: Secondary | ICD-10-CM

## 2020-12-07 DIAGNOSIS — K219 Gastro-esophageal reflux disease without esophagitis: Secondary | ICD-10-CM

## 2020-12-07 MED ORDER — VITAMIN D (ERGOCALCIFEROL) 1.25 MG (50000 UNIT) PO CAPS: 50000.0000 [IU] | ORAL_CAPSULE | ORAL | 3 refills | Status: DC

## 2020-12-07 NOTE — Telephone Encounter (Signed)
Patient responded and reports that she is still experiencing redness, okay to refill?

## 2020-12-07 NOTE — Telephone Encounter (Signed)
Sent MyChart message to patient asking if she still needs Rx.

## 2020-12-08 ENCOUNTER — Other Ambulatory Visit: Payer: Self-pay | Admitting: Gynecologic Oncology

## 2020-12-08 DIAGNOSIS — D071 Carcinoma in situ of vulva: Secondary | ICD-10-CM

## 2020-12-08 NOTE — Telephone Encounter (Signed)
Med was prescribed for post-op, no longer needed

## 2020-12-09 ENCOUNTER — Other Ambulatory Visit: Payer: Self-pay | Admitting: Infectious Diseases

## 2020-12-09 DIAGNOSIS — L299 Pruritus, unspecified: Secondary | ICD-10-CM

## 2020-12-11 NOTE — Telephone Encounter (Signed)
Please advise on refill.

## 2020-12-13 LAB — MULTIPLE MYELOMA PANEL, SERUM
Albumin SerPl Elph-Mcnc: 3.8 g/dL (ref 2.9–4.4)
Albumin/Glob SerPl: 1.1 (ref 0.7–1.7)
Alpha 1: 0.2 g/dL (ref 0.0–0.4)
Alpha2 Glob SerPl Elph-Mcnc: 0.8 g/dL (ref 0.4–1.0)
B-Globulin SerPl Elph-Mcnc: 1 g/dL (ref 0.7–1.3)
Gamma Glob SerPl Elph-Mcnc: 1.6 g/dL (ref 0.4–1.8)
Globulin, Total: 3.7 g/dL (ref 2.2–3.9)
IgA/Immunoglobulin A, Serum: 260 mg/dL (ref 87–352)
IgG (Immunoglobin G), Serum: 1757 mg/dL — ABNORMAL HIGH (ref 586–1602)
IgM (Immunoglobulin M), Srm: 126 mg/dL (ref 26–217)
Total Protein: 7.5 g/dL (ref 6.0–8.5)

## 2020-12-13 LAB — VITAMIN B1: Thiamine: 132.2 nmol/L (ref 66.5–200.0)

## 2020-12-13 LAB — VITAMIN B6: Vitamin B6: 3 ug/L — ABNORMAL LOW (ref 3.4–65.2)

## 2020-12-13 LAB — ANTINUCLEAR ANTIBODIES, IFA: ANA Titer 1: NEGATIVE

## 2020-12-13 LAB — B12 AND FOLATE PANEL
Folate: 6.8 ng/mL (ref >3.0)
Vitamin B-12: 295 pg/mL (ref 232–1245)

## 2020-12-13 LAB — VITAMIN D 25 HYDROXY (VIT D DEFICIENCY, FRACTURES): Vit D, 25-Hydroxy: 23.7 ng/mL — ABNORMAL LOW (ref 30.0–100.0)

## 2020-12-13 LAB — METHYLMALONIC ACID, SERUM: Methylmalonic Acid: 185 nmol/L (ref 0–378)

## 2020-12-13 NOTE — Progress Notes (Signed)
Also in one lab we found an increase in a normal component of blood. This may not mean anything but I want her to be evaluated by hematology for MGUS. Monoclonal gammopathy of undetermined significance (MGUS) is a condition in which an abnormal protein - known as monoclonal protein or M protein - is in your blood. The protein is produced in a type of white blood cell (plasma cells) in your bone marrow. MGUS usually causes no problems. But sometimes it can progress over years to other disorders, including some forms of blood cancer. It's important to have regular checkups to closely monitor monoclonal gammopathy so that if it does progress, you get earlier treatment. If there's no disease progression, MGUS doesn't require treatment. After you talk to patient please place a referral to hematology for M-protein spike thanks.

## 2020-12-18 ENCOUNTER — Telehealth: Payer: Self-pay | Admitting: *Deleted

## 2020-12-18 DIAGNOSIS — D472 Monoclonal gammopathy: Secondary | ICD-10-CM

## 2020-12-18 NOTE — Telephone Encounter (Addendum)
Spoke with the patient and discussed lab results as noted in detail by Dr. Jaynee Eagles.  Patient verbalized understanding and agreed to referral to hematology for further evaluation for MGUS.  Patient also aware not to be alarmed if she receives a call from oncology as the hematology doctors are in the same group. Her questions were answered. She understands she will receive a separate call to schedule the consult.   Order placed for hematology referral for M-protein spike.   ----- Message from Melvenia Beam, MD sent at 12/13/2020  9:59 AM EDT ----- Also in one lab we found an increase in a normal component of blood. This may not mean anything but I want her to be evaluated by hematology for MGUS. Monoclonal gammopathy of undetermined significance (MGUS) is a condition in which an abnormal protein - known as monoclonal protein or M protein - is in your blood. The protein is produced in a type of white blood cell (plasma cells) in your bone marrow. MGUS usually causes no problems. But sometimes it can progress over years to other disorders, including some forms of blood cancer. It's important to have regular checkups to closely monitor monoclonal gammopathy so that if it does progress, you get earlier treatment. If there's no disease progression, MGUS doesn't require treatment. After you talk to patient please place a referral to hematology for M-protein spike thanks.

## 2020-12-19 ENCOUNTER — Telehealth: Payer: Self-pay | Admitting: Hematology and Oncology

## 2020-12-19 NOTE — Telephone Encounter (Signed)
Scheduled appt per 10/17 referral. Pt is aware of appt date and time.
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
## 2020-12-26 ENCOUNTER — Other Ambulatory Visit: Payer: Self-pay

## 2020-12-26 ENCOUNTER — Encounter: Payer: Self-pay | Admitting: Infectious Diseases

## 2020-12-26 ENCOUNTER — Ambulatory Visit (INDEPENDENT_AMBULATORY_CARE_PROVIDER_SITE_OTHER): Payer: 59 | Admitting: Infectious Diseases

## 2020-12-26 VITALS — BP 123/78 | HR 61 | Temp 98.0°F | Wt 156.0 lb

## 2020-12-26 DIAGNOSIS — K7469 Other cirrhosis of liver: Secondary | ICD-10-CM | POA: Diagnosis not present

## 2020-12-26 DIAGNOSIS — B1920 Unspecified viral hepatitis C without hepatic coma: Secondary | ICD-10-CM

## 2020-12-26 DIAGNOSIS — D472 Monoclonal gammopathy: Secondary | ICD-10-CM

## 2020-12-26 NOTE — Progress Notes (Signed)
Patient Name: Laura Sloan  Date of Birth: [birth year]  MRN: [id number]  PCP: Laura Herter, NP  Referring Provider: Camillia Herter, NP, Ph#: [phone]   CC:  Hepatitis C follow up - SVR visit    HPI:  Laura Sloan is a 65 y.o. female with chronic hepatitis C infection due to genotype 1a. Fibrotest and Elastography indicate cirrhosis/F4; Child Pugh A, compensated disease.   Laura Sloan Start: 06/20/2020 [place] End: 09/11/2020  Hep C RNAs:  05/12/2020 - 5,020,000 08/01/2020 - not detected; LFTs improved.  09/25/2020 - not detected   HPI: Has a lot going on with health - worried with abnormal labs done recently to evaluate progressing neuropathy symptoms in bilateral LEs. Seen by neurology recently and MM panel has returned with detectable M-protein spike (IgG elevated). Also with vitamin d/b6 deficiencies.   Feels like the gabapentin has caused her to gain weight. Numbness has moved up to shins. She is also very fatigued and has noticed she has been having to take breaks with activities.  She recalls as a child (around 8) she had a petechial and purpuric reaction to an injection.   Last U/S 10/2020 - negative screen for HCC. Has an appt with GI November 7th. Has only had alcohol 3 times in the last 3 months.    Review of Systems  Constitutional:  Positive for activity change, fatigue and unexpected weight change. Negative for appetite change and fever.  Respiratory:  Negative for shortness of breath.   Cardiovascular:  Negative for chest pain and leg swelling.  Gastrointestinal:  Positive for abdominal pain (LUQ). Negative for blood in stool, nausea and vomiting.       Heart burn  Genitourinary:  Negative for difficulty urinating and hematuria.  Musculoskeletal:  Negative for arthralgias.  Skin:  Positive for rash. Negative for color change.  Neurological:  Negative for dizziness, tremors and headaches.  Hematological:  Negative for adenopathy.   Psychiatric/Behavioral:  Negative for confusion.   All other systems reviewed and are negative.      Past Medical History:  Diagnosis Date   Anxiety    Arthritis    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 ([place]) 05/2020   followed by ID, Laura Madeira NP,  dx 03/ 2022,  hx IV drug use 1980s   Cirrhosis of liver ([place]) 05/2020   Family history of adverse reaction to anesthesia    mother-- hard to wake   Full dentures    GERD (gastroesophageal reflux disease)    Hepatitis B core antibody positive    Seasonal allergies    Smokers' cough ([place])    Vulvar dysplasia    Wears glasses     Prior to Admission medications   Medication Sig Start Date End Date Taking? Authorizing Provider  buPROPion (WELLBUTRIN SR) 150 MG 12 hr tablet Take 1 tablet (150 mg total) by mouth daily for 3 days, THEN 1 tablet (150 mg total) 2 (two) times daily for 27 days. 05/05/20 06/04/20  Laura Herter, NP  omeprazole (PRILOSEC) 20 MG capsule Take 1 capsule (20 mg total) by mouth daily. 05/03/20   Laura Herter, NP  polyethylene glycol (MIRALAX) 17 g packet Take 17 g by mouth daily. 05/03/20   Laura Herter, NP    No Known Allergies  Social History   Tobacco Use   Smoking status: Every Day    Years: 49.00    Types: Cigarettes    Start date: 05/13/1971   Smokeless tobacco:  Never   Tobacco comments:    07-13-2020  pt taking chantix, currently 2ppwk down from 7ppwk  Vaping Use   Vaping Use: Former   Quit date: 07/14/2015  Substance Use Topics   Alcohol use: Not Currently    Comment: due to the medication for liver disease   Drug use: Not Currently    Types: Cocaine    Comment: 07-13-2020  pt stated last drug use since  11-12-2005;  hx IV drug use 1980s      Objective:   Vitals:   12/26/20 0836  BP: 123/78  Pulse: 61  Temp: 98 F (36.7 C)   Body mass index is 25.18 kg/m.   Constitutional: in no apparent distress and well developed and well nourished Eyes:  anicteric Cardiovascular: Cor RRR and No murmurs Respiratory: clear Gastrointestinal: Bowel sounds are normal, liver is not enlarged, spleen is not enlarged Musculoskeletal: peripheral pulses normal, no clubbing or cyanosis Skin: telangiectasias and palmar erythema noted  Lymphatic: no lymphadenopathy    Laboratory: Genotype:  Lab Results  Component Value Date   HCVGENOTYPE 1a 05/12/2020   HCV viral load: No results found for: HCVQUANT Lab Results  Component Value Date   WBC 4.9 08/01/2020   HGB 15.9 (H) 08/01/2020   HCT 45.9 (H) 08/01/2020   MCV 90.5 08/01/2020   PLT 114 (L) 08/01/2020    Lab Results  Component Value Date   CREATININE 0.78 09/25/2020   BUN 18 09/25/2020   NA 138 09/25/2020   K 4.7 09/25/2020   CL 102 09/25/2020   CO2 27 09/25/2020    Lab Results  Component Value Date   ALT 49 (H) 09/25/2020   AST 44 (H) 09/25/2020   GGT 106 (H) 05/12/2020   ALKPHOS 115 05/03/2020    Lab Results  Component Value Date   INR 1.1 08/01/2020   BILITOT 0.9 09/25/2020   ALBUMIN 4.2 05/03/2020     Imaging:  05-2020:  The liver has a heterogeneous and nodular contour consistent with cirrhosis. No focal liver abnormality. Elastography highly suggestive of compensated liver disease. (kPcal >14).   10-2020: stable appearing cirrhosis w/o tumor/mass effect. No ascites     Assessment & Plan:   Problem List Items Addressed This Visit       Unprioritized   Hepatitis C - Primary    SVR labs today - she had excellent therapeutic response during treatment with undetectable VLs; though incomplete resolution of transaminitis. Encouraged to continue to abstain from alcohol for long term liver care recommendation.  Return in February for [facility] screening after ultrasound.      Relevant Orders   Hepatitis C RNA quantitative ([facility])   COMPLETE METABOLIC PANEL WITH GFR   Hepatic cirrhosis ([place])    GI appt coming up Nov 7 for consideration of screening EGD and discussion  around cirrhosis / liver care. She has also had frequent heartburn and some LUQ abdominal pain --> ?GI ulcer.  Will schedule repeat US in Feb 2023, paired with AFP at that time.       Relevant Orders   US ABDOMEN LIMITED RUQ (LIVER/GB)   Gammopathy with multiple M spikes    During neuropathy work up MM panel +for M-spike protein. Has a new patient visit with hematology today. We spent time discussing possible correlation with hepatitis c and MGUS-like conditions. Always recommend to cure the viral infection - which we can hopefully establish today. Will follow with Hematology assessment.      Laura Madeira, MSN, NP-C  [email or web address] [email or web address] Pager: [phone] Office: [phone] [place]: [phone]

## 2020-12-26 NOTE — Assessment & Plan Note (Signed)
During neuropathy work up MM panel +for M-spike protein. Has a new patient visit with hematology today. We spent time discussing possible correlation with hepatitis c and MGUS-like conditions. Always recommend to cure the viral infection - which we can hopefully establish today. Will follow with Hematology assessment.

## 2020-12-26 NOTE — Patient Instructions (Addendum)
Please stop by the lab on your way out so we can make sure you are cured from hepatitis C infection.   Will get you set up with a repeat liver ultrasound in February 2023 and a visit back to see me to review and check in. Would recommend these screenings every 6 months to ensure no changes with your liver.   Will be on the look out for Dr. Libby Maw notes

## 2020-12-26 NOTE — Assessment & Plan Note (Signed)
SVR labs today - she had excellent therapeutic response during treatment with undetectable VLs; though incomplete resolution of transaminitis. Encouraged to continue to abstain from alcohol for long term liver care recommendation.  Return in February for [facility] screening after ultrasound.

## 2020-12-26 NOTE — Assessment & Plan Note (Signed)
GI appt coming up Nov 7 for consideration of screening EGD and discussion around cirrhosis / liver care. She has also had frequent heartburn and some LUQ abdominal pain --> ?GI ulcer.  Will schedule repeat US in Feb 2023, paired with AFP at that time.

## 2020-12-27 ENCOUNTER — Ambulatory Visit (INDEPENDENT_AMBULATORY_CARE_PROVIDER_SITE_OTHER): Payer: 59 | Admitting: Family

## 2020-12-27 ENCOUNTER — Encounter: Payer: Self-pay | Admitting: Family

## 2020-12-27 VITALS — BP 118/74 | HR 64 | Resp 16 | Wt 150.0 lb

## 2020-12-27 DIAGNOSIS — F32A Depression, unspecified: Secondary | ICD-10-CM

## 2020-12-27 DIAGNOSIS — F419 Anxiety disorder, unspecified: Secondary | ICD-10-CM

## 2020-12-27 DIAGNOSIS — Z716 Tobacco abuse counseling: Secondary | ICD-10-CM

## 2020-12-27 DIAGNOSIS — R519 Headache, unspecified: Secondary | ICD-10-CM

## 2020-12-27 DIAGNOSIS — E785 Hyperlipidemia, unspecified: Secondary | ICD-10-CM

## 2020-12-27 MED ORDER — SUMATRIPTAN SUCCINATE 25 MG PO TABS: ORAL_TABLET | ORAL | 1 refills | Status: DC

## 2020-12-27 MED ORDER — SERTRALINE HCL 25 MG PO TABS: 25.0000 mg | ORAL_TABLET | Freq: Every day | ORAL | 0 refills | Status: DC

## 2020-12-27 NOTE — Progress Notes (Signed)
F/u smoking cessation- does not use the medication Headache- medication helping Concerns about cholesterol

## 2020-12-28 ENCOUNTER — Other Ambulatory Visit: Payer: Self-pay

## 2020-12-28 ENCOUNTER — Inpatient Hospital Stay: Payer: 59 | Attending: Hematology and Oncology | Admitting: Hematology and Oncology

## 2020-12-28 ENCOUNTER — Inpatient Hospital Stay: Payer: 59

## 2020-12-28 VITALS — BP 132/87 | HR 67 | Temp 97.6°F | Resp 18 | Ht 66.0 in | Wt 155.0 lb

## 2020-12-28 DIAGNOSIS — D472 Monoclonal gammopathy: Secondary | ICD-10-CM | POA: Diagnosis present

## 2020-12-28 LAB — CBC WITH DIFFERENTIAL (CANCER CENTER ONLY)
Abs Immature Granulocytes: 0.01 10*3/uL (ref 0.00–0.07)
Basophils Absolute: 0 10*3/uL (ref 0.0–0.1)
Basophils Relative: 1 %
Eosinophils Absolute: 0.2 10*3/uL (ref 0.0–0.5)
Eosinophils Relative: 3 %
HCT: 44.4 % (ref 36.0–46.0)
Hemoglobin: 15.2 g/dL — ABNORMAL HIGH (ref 12.0–15.0)
Immature Granulocytes: 0 %
Lymphocytes Relative: 50 %
Lymphs Abs: 2.8 10*3/uL (ref 0.7–4.0)
MCH: 31 pg (ref 26.0–34.0)
MCHC: 34.2 g/dL (ref 30.0–36.0)
MCV: 90.6 fL (ref 80.0–100.0)
Monocytes Absolute: 0.4 10*3/uL (ref 0.1–1.0)
Monocytes Relative: 7 %
Neutro Abs: 2.3 10*3/uL (ref 1.7–7.7)
Neutrophils Relative %: 39 %
Platelet Count: 132 10*3/uL — ABNORMAL LOW (ref 150–400)
RBC: 4.9 MIL/uL (ref 3.87–5.11)
RDW: 12.6 % (ref 11.5–15.5)
WBC Count: 5.7 10*3/uL (ref 4.0–10.5)
nRBC: 0 % (ref 0.0–0.2)

## 2020-12-28 LAB — CMP (CANCER CENTER ONLY)
ALT: 68 U/L — ABNORMAL HIGH (ref 0–44)
AST: 53 U/L — ABNORMAL HIGH (ref 15–41)
Albumin: 4.2 g/dL (ref 3.5–5.0)
Alkaline Phosphatase: 110 U/L (ref 38–126)
Anion gap: 11 (ref 5–15)
BUN: 12 mg/dL (ref 8–23)
CO2: 24 mmol/L (ref 22–32)
Calcium: 9.7 mg/dL (ref 8.9–10.3)
Chloride: 106 mmol/L (ref 98–111)
Creatinine: 0.78 mg/dL (ref 0.44–1.00)
GFR, Estimated: >60 mL/min (ref >60)
Glucose, Bld: 101 mg/dL — ABNORMAL HIGH (ref 70–99)
Potassium: 4.3 mmol/L (ref 3.5–5.1)
Sodium: 141 mmol/L (ref 135–145)
Total Bilirubin: 0.7 mg/dL (ref 0.3–1.2)
Total Protein: 8.3 g/dL — ABNORMAL HIGH (ref 6.5–8.1)

## 2020-12-28 LAB — LACTATE DEHYDROGENASE: LDH: 174 U/L (ref 98–192)

## 2020-12-28 LAB — COMPLETE METABOLIC PANEL WITH GFR
AG Ratio: 1.2 (calc) (ref 1.0–2.5)
ALT: 60 U/L — ABNORMAL HIGH (ref 6–29)
AST: 46 U/L — ABNORMAL HIGH (ref 10–35)
Albumin: 4.1 g/dL (ref 3.6–5.1)
Alkaline phosphatase (APISO): 99 U/L (ref 37–153)
BUN: 15 mg/dL (ref 7–25)
CO2: 27 mmol/L (ref 20–32)
Calcium: 9.7 mg/dL (ref 8.6–10.4)
Chloride: 104 mmol/L (ref 98–110)
Creat: 0.73 mg/dL (ref 0.50–1.05)
Globulin: 3.5 g/dL (calc) (ref 1.9–3.7)
Glucose, Bld: 85 mg/dL (ref 65–99)
Potassium: 4.5 mmol/L (ref 3.5–5.3)
Sodium: 138 mmol/L (ref 135–146)
Total Bilirubin: 0.5 mg/dL (ref 0.2–1.2)
Total Protein: 7.6 g/dL (ref 6.1–8.1)
eGFR: 93 mL/min/{1.73_m2} (ref >=60)

## 2020-12-28 LAB — HEPATITIS C RNA QUANTITATIVE
HCV Quantitative Log: <1.18 log IU/mL
HCV RNA, PCR, QN: <15 IU/mL
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
## 2020-12-29 LAB — KAPPA/LAMBDA LIGHT CHAINS
Kappa free light chain: 68 mg/L — ABNORMAL HIGH (ref 3.3–19.4)
Kappa, lambda light chain ratio: 3.78 — ABNORMAL HIGH (ref 0.26–1.65)
Lambda free light chains: 18 mg/L (ref 5.7–26.3)

## 2020-12-29 LAB — BETA 2 MICROGLOBULIN, SERUM: Beta-2 Microglobulin: 2.2 mg/L (ref 0.6–2.4)

## 2021-01-02 LAB — MULTIPLE MYELOMA PANEL, SERUM
Albumin SerPl Elph-Mcnc: 3.9 g/dL (ref 2.9–4.4)
Albumin/Glob SerPl: 1.1 (ref 0.7–1.7)
Alpha 1: 0.2 g/dL (ref 0.0–0.4)
Alpha2 Glob SerPl Elph-Mcnc: 0.7 g/dL (ref 0.4–1.0)
B-Globulin SerPl Elph-Mcnc: 1.1 g/dL (ref 0.7–1.3)
Gamma Glob SerPl Elph-Mcnc: 1.8 g/dL (ref 0.4–1.8)
Globulin, Total: 3.8 g/dL (ref 2.2–3.9)
IgA: 250 mg/dL (ref 87–352)
IgG (Immunoglobin G), Serum: 1913 mg/dL — ABNORMAL HIGH (ref 586–1602)
IgM (Immunoglobulin M), Srm: 127 mg/dL (ref 26–217)
Total Protein ELP: 7.7 g/dL (ref 6.0–8.5)

## 2021-01-03 ENCOUNTER — Encounter: Payer: Self-pay | Admitting: Hematology and Oncology

## 2021-01-08 ENCOUNTER — Encounter: Payer: Self-pay | Admitting: Physician Assistant

## 2021-01-08 ENCOUNTER — Ambulatory Visit (INDEPENDENT_AMBULATORY_CARE_PROVIDER_SITE_OTHER): Payer: 59 | Admitting: Physician Assistant

## 2021-01-08 ENCOUNTER — Other Ambulatory Visit (INDEPENDENT_AMBULATORY_CARE_PROVIDER_SITE_OTHER): Payer: 59

## 2021-01-08 VITALS — BP 128/64 | HR 66 | Ht 66.0 in | Wt 150.4 lb

## 2021-01-08 DIAGNOSIS — K219 Gastro-esophageal reflux disease without esophagitis: Secondary | ICD-10-CM

## 2021-01-08 DIAGNOSIS — K7460 Unspecified cirrhosis of liver: Secondary | ICD-10-CM

## 2021-01-08 LAB — CBC WITH DIFFERENTIAL/PLATELET
Basophils Absolute: 0 10*3/uL (ref 0.0–0.1)
Basophils Relative: 0.4 % (ref 0.0–3.0)
Eosinophils Absolute: 0.2 10*3/uL (ref 0.0–0.7)
Eosinophils Relative: 1.6 % (ref 0.0–5.0)
HCT: 48.4 % — ABNORMAL HIGH (ref 36.0–46.0)
Hemoglobin: 16.5 g/dL — ABNORMAL HIGH (ref 12.0–15.0)
Lymphocytes Relative: 25.2 % (ref 12.0–46.0)
Lymphs Abs: 2.7 10*3/uL (ref 0.7–4.0)
MCHC: 34 g/dL (ref 30.0–36.0)
MCV: 90.8 fl (ref 78.0–100.0)
Monocytes Absolute: 0.7 10*3/uL (ref 0.1–1.0)
Monocytes Relative: 6 % (ref 3.0–12.0)
Neutro Abs: 7.2 10*3/uL (ref 1.4–7.7)
Neutrophils Relative %: 66.8 % (ref 43.0–77.0)
Platelets: 157 10*3/uL (ref 150.0–400.0)
RBC: 5.33 Mil/uL — ABNORMAL HIGH (ref 3.87–5.11)
RDW: 12.8 % (ref 11.5–15.5)
WBC: 10.8 10*3/uL — ABNORMAL HIGH (ref 4.0–10.5)

## 2021-01-08 LAB — COMPREHENSIVE METABOLIC PANEL
ALT: 54 U/L — ABNORMAL HIGH (ref 0–35)
AST: 48 U/L — ABNORMAL HIGH (ref 0–37)
Albumin: 4.8 g/dL (ref 3.5–5.2)
Alkaline Phosphatase: 110 U/L (ref 39–117)
BUN: 19 mg/dL (ref 6–23)
CO2: 28 mEq/L (ref 19–32)
Calcium: 10.4 mg/dL (ref 8.4–10.5)
Chloride: 101 mEq/L (ref 96–112)
Creatinine, Ser: 0.84 mg/dL (ref 0.40–1.20)
GFR: 74.22 mL/min (ref >60.00)
Glucose, Bld: 102 mg/dL — ABNORMAL HIGH (ref 70–99)
Potassium: 4.3 mEq/L (ref 3.5–5.1)
Sodium: 137 mEq/L (ref 135–145)
Total Bilirubin: 0.8 mg/dL (ref 0.2–1.2)
Total Protein: 8.8 g/dL — ABNORMAL HIGH (ref 6.0–8.3)

## 2021-01-08 LAB — PROTIME-INR
INR: 1.2 ratio — ABNORMAL HIGH (ref 0.8–1.0)
Prothrombin Time: 12.6 s (ref 9.6–13.1)

## 2021-01-08 NOTE — Patient Instructions (Signed)
Your provider has requested that you go to the basement level for lab work before leaving today. Press "B" on the elevator. The lab is located at the first door on the left as you exit the elevator.  You have been scheduled for an endoscopy. Please follow written instructions given to you at your visit today. If you use inhalers (even only as needed), please bring them with you on the day of your procedure.  If you are age 63 or older, your body mass index should be between 23-30. Your Body mass index is 24.28 kg/m. If this is out of the aforementioned range listed, please consider follow up with your Primary Care Provider.  If you are age 58 or younger, your body mass index should be between 19-25. Your Body mass index is 24.28 kg/m. If this is out of the aformentioned range listed, please consider follow up with your Primary Care Provider.   ________________________________________________________  The [facility] providers would like to encourage you to use [facility] to communicate with providers for non-urgent requests or questions.  Due to long hold times on the telephone, sending your provider a message by [facility] may be a faster and more efficient way to get a response.  Please allow 48 business hours for a response.  Please remember that this is for non-urgent requests.  _______________________________________________________
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
## 2021-01-11 LAB — ANTI-NUCLEAR AB-TITER (ANA TITER)
ANA TITER: 1:40 {titer} — ABNORMAL HIGH
ANA Titer 1: 1:40 {titer} — ABNORMAL HIGH

## 2021-01-11 LAB — ALPHA-1-ANTITRYPSIN: A-1 Antitrypsin, Ser: 173 mg/dL (ref 83–199)

## 2021-01-11 LAB — AFP TUMOR MARKER: AFP-Tumor Marker: 8.9 ng/mL — ABNORMAL HIGH

## 2021-01-11 LAB — MITOCHONDRIAL ANTIBODIES: Mitochondrial M2 Ab, IgG: <=20 U (ref <=20.0)

## 2021-01-11 LAB — ANTI-SMOOTH MUSCLE ANTIBODY, IGG: Actin (Smooth Muscle) Antibody (IGG): 30 U — ABNORMAL HIGH (ref <20)

## 2021-01-11 LAB — ANA: Anti Nuclear Antibody (ANA): POSITIVE — AB

## 2021-01-11 LAB — CERULOPLASMIN: Ceruloplasmin: 26 mg/dL (ref 18–53)

## 2021-01-15 ENCOUNTER — Encounter: Payer: 59 | Admitting: Gastroenterology

## 2021-01-19 ENCOUNTER — Other Ambulatory Visit: Payer: Self-pay | Admitting: Family

## 2021-01-19 DIAGNOSIS — F419 Anxiety disorder, unspecified: Secondary | ICD-10-CM

## 2021-01-19 DIAGNOSIS — F32A Depression, unspecified: Secondary | ICD-10-CM
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
## 2021-01-29 ENCOUNTER — Other Ambulatory Visit: Payer: Self-pay | Admitting: *Deleted

## 2021-01-29 ENCOUNTER — Inpatient Hospital Stay: Payer: 59 | Attending: Hematology and Oncology

## 2021-01-29 DIAGNOSIS — D696 Thrombocytopenia, unspecified: Secondary | ICD-10-CM | POA: Insufficient documentation

## 2021-01-29 DIAGNOSIS — K7460 Unspecified cirrhosis of liver: Secondary | ICD-10-CM | POA: Diagnosis not present

## 2021-01-29 DIAGNOSIS — D472 Monoclonal gammopathy: Secondary | ICD-10-CM | POA: Insufficient documentation

## 2021-01-29 DIAGNOSIS — Z79899 Other long term (current) drug therapy: Secondary | ICD-10-CM | POA: Diagnosis not present

## 2021-01-30 ENCOUNTER — Encounter: Payer: Self-pay | Admitting: Family

## 2021-01-30 ENCOUNTER — Other Ambulatory Visit: Payer: Self-pay

## 2021-01-30 ENCOUNTER — Ambulatory Visit (INDEPENDENT_AMBULATORY_CARE_PROVIDER_SITE_OTHER): Payer: 59 | Admitting: Family

## 2021-01-30 VITALS — BP 137/86 | HR 72 | Temp 98.3°F | Resp 18 | Ht 65.98 in | Wt 147.0 lb

## 2021-01-30 DIAGNOSIS — F32A Depression, unspecified: Secondary | ICD-10-CM

## 2021-01-30 DIAGNOSIS — F419 Anxiety disorder, unspecified: Secondary | ICD-10-CM

## 2021-01-30 DIAGNOSIS — R0989 Other specified symptoms and signs involving the circulatory and respiratory systems: Secondary | ICD-10-CM

## 2021-01-30 NOTE — Progress Notes (Signed)
Pt presents for anxiety follow-up, pt reports she had RSV on 11/8 and still having symptoms of chest congestion

## 2021-01-31 ENCOUNTER — Other Ambulatory Visit: Payer: Self-pay | Admitting: Student

## 2021-01-31 LAB — UPEP/UIFE/LIGHT CHAINS/TP, 24-HR UR
% BETA, Urine: 0 %
ALPHA 1 URINE: 0 %
Albumin, U: 100 %
Alpha 2, Urine: 0 %
Free Kappa Lt Chains,Ur: 5.83 mg/L (ref 1.17–86.46)
Free Kappa/Lambda Ratio: 6.07 (ref 1.83–14.26)
Free Lambda Lt Chains,Ur: 0.96 mg/L (ref 0.27–15.21)
GAMMA GLOBULIN URINE: 0 %
Total Protein, Urine-Ur/day: 70 mg/24 hr (ref 30–150)
Total Protein, Urine: 7.4 mg/dL
Total Volume: 950
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
## 2021-02-01 ENCOUNTER — Ambulatory Visit (HOSPITAL_COMMUNITY)
Admission: RE | Admit: 2021-02-01 | Discharge: 2021-02-01 | Disposition: A | Discharge status: Home / Self Care | Payer: 59 | Source: Ambulatory Visit | Attending: Hematology and Oncology | Admitting: Hematology and Oncology

## 2021-02-01 ENCOUNTER — Encounter (HOSPITAL_COMMUNITY): Payer: Self-pay

## 2021-02-01 ENCOUNTER — Other Ambulatory Visit: Payer: Self-pay

## 2021-02-01 DIAGNOSIS — D696 Thrombocytopenia, unspecified: Secondary | ICD-10-CM | POA: Insufficient documentation

## 2021-02-01 DIAGNOSIS — K7460 Unspecified cirrhosis of liver: Secondary | ICD-10-CM | POA: Insufficient documentation

## 2021-02-01 DIAGNOSIS — B182 Chronic viral hepatitis C: Secondary | ICD-10-CM | POA: Diagnosis not present

## 2021-02-01 DIAGNOSIS — K4090 Unilateral inguinal hernia, without obstruction or gangrene, not specified as recurrent: Secondary | ICD-10-CM | POA: Diagnosis not present

## 2021-02-01 DIAGNOSIS — I700 Atherosclerosis of aorta: Secondary | ICD-10-CM | POA: Insufficient documentation

## 2021-02-01 DIAGNOSIS — F419 Anxiety disorder, unspecified: Secondary | ICD-10-CM | POA: Diagnosis not present

## 2021-02-01 DIAGNOSIS — K219 Gastro-esophageal reflux disease without esophagitis: Secondary | ICD-10-CM | POA: Insufficient documentation

## 2021-02-01 DIAGNOSIS — B1910 Unspecified viral hepatitis B without hepatic coma: Secondary | ICD-10-CM | POA: Diagnosis not present

## 2021-02-01 DIAGNOSIS — D72822 Plasmacytosis: Secondary | ICD-10-CM | POA: Diagnosis not present

## 2021-02-01 DIAGNOSIS — D472 Monoclonal gammopathy: Secondary | ICD-10-CM | POA: Insufficient documentation

## 2021-02-01 DIAGNOSIS — J410 Simple chronic bronchitis: Secondary | ICD-10-CM | POA: Diagnosis not present

## 2021-02-01 DIAGNOSIS — F17210 Nicotine dependence, cigarettes, uncomplicated: Secondary | ICD-10-CM | POA: Diagnosis not present

## 2021-02-01 HISTORY — PX: BONE MARROW BIOPSY: SHX199

## 2021-02-01 LAB — CBC WITH DIFFERENTIAL/PLATELET
Abs Immature Granulocytes: 0.01 10*3/uL (ref 0.00–0.07)
Basophils Absolute: 0 10*3/uL (ref 0.0–0.1)
Basophils Relative: 1 %
Eosinophils Absolute: 0.2 10*3/uL (ref 0.0–0.5)
Eosinophils Relative: 3 %
HCT: 45.3 % (ref 36.0–46.0)
Hemoglobin: 15.1 g/dL — ABNORMAL HIGH (ref 12.0–15.0)
Immature Granulocytes: 0 %
Lymphocytes Relative: 47 %
Lymphs Abs: 2.9 10*3/uL (ref 0.7–4.0)
MCH: 30.8 pg (ref 26.0–34.0)
MCHC: 33.3 g/dL (ref 30.0–36.0)
MCV: 92.3 fL (ref 80.0–100.0)
Monocytes Absolute: 0.5 10*3/uL (ref 0.1–1.0)
Monocytes Relative: 9 %
Neutro Abs: 2.4 10*3/uL (ref 1.7–7.7)
Neutrophils Relative %: 40 %
Platelets: 130 10*3/uL — ABNORMAL LOW (ref 150–400)
RBC: 4.91 MIL/uL (ref 3.87–5.11)
RDW: 12.5 % (ref 11.5–15.5)
WBC: 6.1 10*3/uL (ref 4.0–10.5)
nRBC: 0 % (ref 0.0–0.2)

## 2021-02-01 MED ORDER — SODIUM CHLORIDE 0.9 % IV SOLN: INTRAVENOUS | Status: DC

## 2021-02-01 MED ORDER — FLUMAZENIL 0.5 MG/5ML IV SOLN
INTRAVENOUS | Status: AC
  Filled 2021-02-01: qty 5

## 2021-02-01 MED ORDER — FENTANYL CITRATE (PF) 100 MCG/2ML IJ SOLN
INTRAMUSCULAR | Status: AC | PRN
  Administered 2021-02-01: 50 ug via INTRAVENOUS

## 2021-02-01 MED ORDER — NALOXONE HCL 0.4 MG/ML IJ SOLN
INTRAMUSCULAR | Status: AC
  Filled 2021-02-01: qty 1

## 2021-02-01 MED ORDER — MIDAZOLAM HCL 2 MG/2ML IJ SOLN
INTRAMUSCULAR | Status: AC
  Filled 2021-02-01: qty 4

## 2021-02-01 MED ORDER — FENTANYL CITRATE (PF) 100 MCG/2ML IJ SOLN
INTRAMUSCULAR | Status: AC
  Filled 2021-02-01: qty 4

## 2021-02-01 MED ORDER — MIDAZOLAM HCL 2 MG/2ML IJ SOLN
INTRAMUSCULAR | Status: AC | PRN
  Administered 2021-02-01: 1 mg via INTRAVENOUS

## 2021-02-01 NOTE — Procedures (Signed)
Interventional Radiology Procedure:   Indications: IgA Kappa Monoclonal Gammopathy  Procedure: CT guided bone marrow biopsy  Findings: 2 aspirates and 3 cores from right ilium  Complications: None     EBL: Minimal, less than 10 ml  Plan: Discharge to home in one hour.   August Longest R. Anselm Pancoast, MD  Pager: [phone]

## 2021-02-06 ENCOUNTER — Encounter: Payer: Self-pay | Admitting: Hematology and Oncology

## 2021-02-09 ENCOUNTER — Other Ambulatory Visit: Payer: Self-pay | Admitting: *Deleted

## 2021-02-09 ENCOUNTER — Telehealth: Payer: Self-pay | Admitting: *Deleted

## 2021-02-09 DIAGNOSIS — D472 Monoclonal gammopathy: Secondary | ICD-10-CM

## 2021-02-09 NOTE — Telephone Encounter (Signed)
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 He recommends a  bone scan to check for any potential bone lesions.  Advised that [facility] scheduling will be calling her for that. Advised that we will see her back in this clinic in 6 months.

## 2021-02-09 NOTE — Telephone Encounter (Signed)
TCT patient regarding recent bone marrow biopsy. No naswer but was able to leave vm message for pt to return this call @ [phone] to discuss bone marrow biopsy results and plan going forward-bone survey and visits every 6 months for labs and MD visit.

## 2021-02-09 NOTE — Telephone Encounter (Signed)
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  There is no evidence of multiple myeloma at this time.  She will require a DG bone met survey (please order).  Please let her know that MGUS is a condition that we will need to monitor.  We will plan to see her back in approximately 6 months time (please schedule) ----- Message ----- From: Interface, Lab In [facility] Sent: 02/07/2021   5:17 PM EST To: Orson Slick, MD

## 2021-02-12 ENCOUNTER — Encounter (HOSPITAL_COMMUNITY): Payer: Self-pay | Admitting: Hematology and Oncology

## 2021-02-13 LAB — SURGICAL PATHOLOGY

## 2021-02-14 ENCOUNTER — Encounter: Payer: Self-pay | Admitting: Gastroenterology

## 2021-02-14 ENCOUNTER — Ambulatory Visit (AMBULATORY_SURGERY_CENTER): Payer: 59 | Admitting: Gastroenterology

## 2021-02-14 ENCOUNTER — Other Ambulatory Visit: Payer: Self-pay

## 2021-02-14 VITALS — BP 126/77 | HR 58 | Temp 98.2°F | Resp 16 | Ht 66.0 in | Wt 150.0 lb

## 2021-02-14 DIAGNOSIS — K319 Disease of stomach and duodenum, unspecified: Secondary | ICD-10-CM

## 2021-02-14 DIAGNOSIS — K449 Diaphragmatic hernia without obstruction or gangrene: Secondary | ICD-10-CM | POA: Diagnosis not present

## 2021-02-14 DIAGNOSIS — K766 Portal hypertension: Secondary | ICD-10-CM

## 2021-02-14 DIAGNOSIS — K7460 Unspecified cirrhosis of liver: Secondary | ICD-10-CM

## 2021-02-14 DIAGNOSIS — K219 Gastro-esophageal reflux disease without esophagitis: Secondary | ICD-10-CM

## 2021-02-14 MED ORDER — SODIUM CHLORIDE 0.9 % IV SOLN: 500.0000 mL | Freq: Once | INTRAVENOUS | Status: DC

## 2021-02-14 NOTE — Progress Notes (Signed)
There is no schedule out for 6 months ahead.   Patient will call to make an office visit for May/June.

## 2021-02-14 NOTE — Op Note (Signed)
[facility] Patient Name: Laura Sloan Procedure Date: 02/14/2021 10:27 AM MRN: [id number] Endoscopist: Mauri Pole , MD Age: 65 Referring MD:  Date of Birth: 15-Jul-1955 Gender: Female Account #: [id number] Procedure:                Upper GI endoscopy Indications:              To evaluate esophageal varices in patient with                            suspected portal hypertension, Exclusion of                            esophageal varices in patient with suspected portal                            hypertension Medicines:                Monitored Anesthesia Care Procedure:                Pre-Anesthesia Assessment:                           - Prior to the procedure, a History and Physical                            was performed, and patient medications and                            allergies were reviewed. The patient's tolerance of                            previous anesthesia was also reviewed. The risks                            and benefits of the procedure and the sedation                            options and risks were discussed with the patient.                            All questions were answered, and informed consent                            was obtained. Prior Anticoagulants: The patient has                            taken no previous anticoagulant or antiplatelet                            agents. ASA Grade Assessment: III - A patient with                            severe systemic disease. After reviewing the risks  and benefits, the patient was deemed in                            satisfactory condition to undergo the procedure.                           After obtaining informed consent, the endoscope was                            passed under direct vision. Throughout the                            procedure, the patient's blood pressure, pulse, and                            oxygen saturations were monitored  continuously. The                            Endoscope was introduced through the mouth, and                            advanced to the second part of duodenum. The upper                            GI endoscopy was accomplished without difficulty.                            The patient tolerated the procedure well. Scope In: Scope Out: Findings:                 The Z-line was regular and was found 36 cm from the                            incisors.                           No gross lesions were noted in the entire esophagus.                           A 3 cm hiatal hernia was present.                           Mild portal hypertensive gastropathy was found in                            the gastric body.                           The cardia and gastric fundus were normal on                            retroflexion.                           The examined duodenum was normal. Complications:  No immediate complications. Estimated Blood Loss:     Estimated blood loss: none. Impression:               - Z-line regular, 36 cm from the incisors.                           - No gross lesions in esophagus.                           - 3 cm hiatal hernia.                           - Portal hypertensive gastropathy.                           - Normal examined duodenum.                           - No specimens collected. Recommendation:           - Patient has a contact number available for                            emergencies. The signs and symptoms of potential                            delayed complications were discussed with the                            patient. Return to normal activities tomorrow.                            Written discharge instructions were provided to the                            patient.                           - Resume previous diet.                           - Continue present medications.                           - Repeat upper endoscopy in 2-3 years  for                            esophageal varices screening purposes.                           - Follow up in GI office in 6 months Mauri Pole, MD 02/14/2021 10:39:50 AM This report has been signed electronically.

## 2021-02-14 NOTE — Progress Notes (Signed)
VS by DT  Pt's states no medical or surgical changes since previsit or office visit.  

## 2021-02-14 NOTE — Progress Notes (Signed)
To PACU, VSS. Report to [email or web address] 

## 2021-02-14 NOTE — Patient Instructions (Signed)
Read all of the handouts given to you by your recover room nurse.  You will need to call the office in March to get an office appointment in May/June.  YOU HAD AN ENDOSCOPIC PROCEDURE TODAY AT [facility]:   Refer to the procedure report that was given to you for any specific questions about what was found during the examination.  If the procedure report does not answer your questions, please call your gastroenterologist to clarify.  If you requested that your care partner not be given the details of your procedure findings, then the procedure report has been included in a sealed envelope for you to review at your convenience later.  YOU SHOULD EXPECT: Some feelings of bloating in the abdomen. Passage of more gas than usual.  Walking can help get rid of the air that was put into your GI tract during the procedure and reduce the bloating. If you had a lower endoscopy (such as a colonoscopy or flexible sigmoidoscopy) you may notice spotting of blood in your stool or on the toilet paper. If you underwent a bowel prep for your procedure, you may not have a normal bowel movement for a few days.  Please Note:  You might notice some irritation and congestion in your nose or some drainage.  This is from the oxygen used during your procedure.  There is no need for concern and it should clear up in a day or so.  SYMPTOMS TO REPORT IMMEDIATELY:    Following upper endoscopy (EGD)  Vomiting of blood or coffee ground material  New chest pain or pain under the shoulder blades  Painful or persistently difficult swallowing  New shortness of breath  Fever of 100F or higher  Black, tarry-looking stools  For urgent or emergent issues, a gastroenterologist can be reached at any hour by calling [phone]. Do not use MyChart messaging for urgent concerns.    DIET:  We do recommend a small meal at first, but then you may proceed to your regular diet.  Drink plenty of fluids but you should avoid  alcoholic beverages.  ACTIVITY:  You should plan to take it easy for the rest of today and you should NOT DRIVE or use heavy machinery until tomorrow (because of the sedation medicines used during the test).    FOLLOW UP: Our staff will call the number listed on your records 48-72 hours following your procedure to check on you and address any questions or concerns that you may have regarding the information given to you following your procedure. If we do not reach you, we will leave a message.  We will attempt to reach you two times.  During this call, we will ask if you have developed any symptoms of COVID 19. If you develop any symptoms (ie: fever, flu-like symptoms, shortness of breath, cough etc.) before then, please call [phone].  If you test positive for Covid 19 in the 2 weeks post procedure, please call and report this information to [place].     SIGNATURES/CONFIDENTIALITY: You and/or your care partner have signed paperwork which will be entered into your electronic medical record.  These signatures attest to the fact that that the information above on your After Visit Summary has been reviewed and is understood.  Full responsibility of the confidentiality of this discharge information lies with you and/or your care-partner.
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
## 2021-02-16 ENCOUNTER — Ambulatory Visit: Payer: 59 | Admitting: Gastroenterology

## 2021-02-16 ENCOUNTER — Telehealth: Payer: Self-pay

## 2021-02-16 NOTE — Telephone Encounter (Signed)
°  Follow up Call-  Call back number 02/14/2021 07/12/2020  Post procedure Call Back phone  # [phone]  Permission to leave phone message Yes Yes  Some recent data might be hidden     Patient questions:  Do you have a fever, pain , or abdominal swelling? No. Pain Score  0 *  Have you tolerated food without any problems? Yes.    Have you been able to return to your normal activities? Yes.    Do you have any questions about your discharge instructions: Diet   No. Medications  No. Follow up visit  No.  Do you have questions or concerns about your Care? No.  Actions: * If pain score is 4 or above: No action needed, pain <4.

## 2021-02-21 ENCOUNTER — Ambulatory Visit: Payer: 59 | Admitting: Family

## 2021-03-05 ENCOUNTER — Other Ambulatory Visit: Payer: Self-pay | Admitting: Neurology

## 2021-03-05 DIAGNOSIS — E559 Vitamin D deficiency, unspecified: Secondary | ICD-10-CM

## 2021-03-07 ENCOUNTER — Telehealth: Payer: Self-pay | Admitting: *Deleted

## 2021-03-07 NOTE — Telephone Encounter (Signed)
Pt called about her vit D refill pharmacy not filling it.  I called and spoke to pharmacy and he stated that there is 5 pills left.  I relayed that pharmacy will fill for her. 5 capsules.  She is to keep her appt with MM/NP as scheduled.  She had question about that since has been referred to other providers and had additional testing.  I relayed to keep appt as scheduled and if not needed after that will be discussed at that appt.  She verbalized understanding.

## 2021-03-13 NOTE — Progress Notes (Signed)
Reviewed and agree with documentation and assessment and plan. K. Veena Azizi Bally , MD   

## 2021-03-23 ENCOUNTER — Other Ambulatory Visit: Payer: Self-pay | Admitting: Family

## 2021-03-23 DIAGNOSIS — F32A Depression, unspecified: Secondary | ICD-10-CM

## 2021-04-09 ENCOUNTER — Ambulatory Visit (INDEPENDENT_AMBULATORY_CARE_PROVIDER_SITE_OTHER): Payer: 59 | Admitting: Adult Health

## 2021-04-09 ENCOUNTER — Other Ambulatory Visit: Payer: Self-pay

## 2021-04-09 ENCOUNTER — Encounter: Payer: Self-pay | Admitting: Adult Health

## 2021-04-09 ENCOUNTER — Encounter: Payer: Self-pay | Admitting: Hematology and Oncology

## 2021-04-09 VITALS — BP 124/81 | HR 66 | Ht 66.0 in | Wt 148.8 lb

## 2021-04-09 DIAGNOSIS — E538 Deficiency of other specified B group vitamins: Secondary | ICD-10-CM

## 2021-04-09 DIAGNOSIS — E559 Vitamin D deficiency, unspecified: Secondary | ICD-10-CM | POA: Diagnosis not present

## 2021-04-09 DIAGNOSIS — D472 Monoclonal gammopathy: Secondary | ICD-10-CM | POA: Diagnosis not present

## 2021-04-09 DIAGNOSIS — G63 Polyneuropathy in diseases classified elsewhere: Secondary | ICD-10-CM

## 2021-04-09 NOTE — Patient Instructions (Signed)
Your Plan:  Continue vitamins Continue gabapentin     Thank you for coming to see [place] at [facility]. I hope we have been able to provide you high quality care today.  You may receive a patient satisfaction survey over the next few weeks. We would appreciate your feedback and comments so that we may continue to improve ourselves and the health of our patients.
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
## 2021-04-10 LAB — VITAMIN B12: Vitamin B-12: 769 pg/mL (ref 232–1245)

## 2021-04-10 LAB — VITAMIN D 25 HYDROXY (VIT D DEFICIENCY, FRACTURES): Vit D, 25-Hydroxy: 55 ng/mL (ref 30.0–100.0)

## 2021-04-13 ENCOUNTER — Ambulatory Visit (HOSPITAL_COMMUNITY)
Admission: RE | Admit: 2021-04-13 | Discharge: 2021-04-13 | Disposition: A | Discharge status: Home / Self Care | Payer: 59 | Source: Ambulatory Visit | Attending: Infectious Diseases | Admitting: Infectious Diseases

## 2021-04-13 ENCOUNTER — Other Ambulatory Visit: Payer: Self-pay

## 2021-04-13 ENCOUNTER — Ambulatory Visit (HOSPITAL_COMMUNITY)
Admission: RE | Admit: 2021-04-13 | Discharge: 2021-04-13 | Disposition: A | Discharge status: Home / Self Care | Payer: 59 | Source: Ambulatory Visit | Attending: Hematology and Oncology | Admitting: Hematology and Oncology

## 2021-04-13 DIAGNOSIS — K7469 Other cirrhosis of liver: Secondary | ICD-10-CM | POA: Insufficient documentation

## 2021-04-13 DIAGNOSIS — D472 Monoclonal gammopathy: Secondary | ICD-10-CM | POA: Insufficient documentation

## 2021-04-16 ENCOUNTER — Telehealth: Payer: Self-pay | Admitting: *Deleted

## 2021-04-16 NOTE — Telephone Encounter (Signed)
-----   Message from Orson Slick, MD sent at 04/15/2021  7:57 PM EST ----- Please let Laura Sloan know that her metastatic survey showed no evidence of lytic lesions. We will plan to see her back in late April 2024 to reassess.  ----- Message ----- From: Interface, Rad Results In Sent: 04/15/2021   7:51 AM EST To: Orson Slick, MD

## 2021-04-16 NOTE — Telephone Encounter (Signed)
TCT patient regarding recent bone survey.  Spoke with pt and advised that her bone survey did not show any lytic lesions. Advised that we will see her back in April 2024. Pt voiced understanding
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
## 2021-04-21 ENCOUNTER — Other Ambulatory Visit: Payer: Self-pay | Admitting: Neurology

## 2021-04-21 DIAGNOSIS — E559 Vitamin D deficiency, unspecified: Secondary | ICD-10-CM

## 2021-04-24 ENCOUNTER — Encounter: Payer: Self-pay | Admitting: Family

## 2021-04-24 ENCOUNTER — Ambulatory Visit (INDEPENDENT_AMBULATORY_CARE_PROVIDER_SITE_OTHER): Payer: 59 | Admitting: Family

## 2021-04-24 VITALS — BP 118/73 | HR 70 | Temp 98.5°F | Resp 18 | Ht 65.98 in | Wt 145.0 lb

## 2021-04-24 DIAGNOSIS — R0989 Other specified symptoms and signs involving the circulatory and respiratory systems: Secondary | ICD-10-CM

## 2021-04-24 DIAGNOSIS — F17200 Nicotine dependence, unspecified, uncomplicated: Secondary | ICD-10-CM

## 2021-04-24 DIAGNOSIS — J329 Chronic sinusitis, unspecified: Secondary | ICD-10-CM

## 2021-04-24 DIAGNOSIS — F32A Depression, unspecified: Secondary | ICD-10-CM

## 2021-04-24 DIAGNOSIS — F419 Anxiety disorder, unspecified: Secondary | ICD-10-CM

## 2021-04-24 DIAGNOSIS — E785 Hyperlipidemia, unspecified: Secondary | ICD-10-CM | POA: Diagnosis not present

## 2021-04-24 DIAGNOSIS — J439 Emphysema, unspecified: Secondary | ICD-10-CM | POA: Diagnosis not present

## 2021-04-24 DIAGNOSIS — R918 Other nonspecific abnormal finding of lung field: Secondary | ICD-10-CM

## 2021-04-24 MED ORDER — AMOXICILLIN-POT CLAVULANATE 875-125 MG PO TABS: 1.0000 | ORAL_TABLET | Freq: Two times a day (BID) | ORAL | 0 refills | Status: AC

## 2021-04-24 NOTE — Progress Notes (Signed)
Pt presents for anxiety follow-up, pt states that she has been dealing with chest congestion since November and wants something to clear it up

## 2021-04-25 ENCOUNTER — Other Ambulatory Visit: Payer: Self-pay | Admitting: Family

## 2021-04-25 DIAGNOSIS — E785 Hyperlipidemia, unspecified: Secondary | ICD-10-CM

## 2021-04-25 DIAGNOSIS — I700 Atherosclerosis of aorta: Secondary | ICD-10-CM

## 2021-04-25 LAB — LIPID PANEL
Chol/HDL Ratio: 4.4 ratio (ref 0.0–4.4)
Cholesterol, Total: 221 mg/dL — ABNORMAL HIGH (ref 100–199)
HDL: 50 mg/dL (ref >39)
LDL Chol Calc (NIH): 141 mg/dL — ABNORMAL HIGH (ref 0–99)
Triglycerides: 167 mg/dL — ABNORMAL HIGH (ref 0–149)
VLDL Cholesterol Cal: 30 mg/dL (ref 5–40)

## 2021-04-25 LAB — ALT: ALT: 44 IU/L — ABNORMAL HIGH (ref 0–32)

## 2021-04-25 LAB — AST: AST: 44 IU/L — ABNORMAL HIGH (ref 0–40)

## 2021-04-25 NOTE — Progress Notes (Signed)
Cholesterol remaining higher than normal. Referral to Cardiology for cholesterol and aortic atherosclerosis. Their office should call within 2 weeks with appointment details.   Ok to increase Sertraline to 50 mg daily. Take two of current 25 mg tablets daily. Follow-up with primary provider in 4 weeks.   The following is for provider reference only: The 10-year ASCVD risk score (Arnett DK, et al., 2019) is: 8.3%   Values used to calculate the score:     Age: 66 years     Sex: Female     Is Non-Hispanic African American: No     Diabetic: No     Tobacco smoker: Yes     Systolic Blood Pressure: 592 mmHg     Is BP treated: No     HDL Cholesterol: 50 mg/dL     Total Cholesterol: 221 mg/dL

## 2021-05-02 ENCOUNTER — Other Ambulatory Visit: Payer: Self-pay | Admitting: Family

## 2021-05-02 DIAGNOSIS — R519 Headache, unspecified: Secondary | ICD-10-CM
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
## 2021-05-15 ENCOUNTER — Encounter: Payer: Self-pay | Admitting: Internal Medicine

## 2021-05-15 ENCOUNTER — Other Ambulatory Visit: Payer: Self-pay

## 2021-05-15 ENCOUNTER — Ambulatory Visit (INDEPENDENT_AMBULATORY_CARE_PROVIDER_SITE_OTHER): Payer: 59 | Admitting: Internal Medicine

## 2021-05-15 VITALS — BP 122/84 | HR 58 | Ht 66.0 in | Wt 149.2 lb

## 2021-05-15 DIAGNOSIS — I2584 Coronary atherosclerosis due to calcified coronary lesion: Secondary | ICD-10-CM

## 2021-05-15 DIAGNOSIS — K7469 Other cirrhosis of liver: Secondary | ICD-10-CM

## 2021-05-15 DIAGNOSIS — I2510 Atherosclerotic heart disease of native coronary artery without angina pectoris: Secondary | ICD-10-CM

## 2021-05-15 DIAGNOSIS — I739 Peripheral vascular disease, unspecified: Secondary | ICD-10-CM

## 2021-05-15 DIAGNOSIS — I700 Atherosclerosis of aorta: Secondary | ICD-10-CM | POA: Insufficient documentation

## 2021-05-15 MED ORDER — SIMVASTATIN 40 MG PO TABS: 40.0000 mg | ORAL_TABLET | Freq: Every day | ORAL | 3 refills | Status: DC

## 2021-05-15 MED ORDER — ASPIRIN EC 81 MG PO TBEC: 81.0000 mg | DELAYED_RELEASE_TABLET | Freq: Every day | ORAL | 11 refills | Status: DC

## 2021-05-15 NOTE — Patient Instructions (Signed)
Medication Instructions:  ?Your physician has recommended you make the following change in your medication:  ?START: Aspirin 81 mg by mouth daily ?START: simvastatin (Zocor) 40 mg by mouth daily (take around dinner time) ?*If you need a refill on your cardiac medications before your next appointment, please call your pharmacy* ? ? ?Lab Work: ?IN 1 MONTH: CBC, LFT, FLP (please fast 8-12 hours prior: nothing to eat or drink except water 8-12 hours prior)  ?If you have labs (blood work) drawn today and your tests are completely normal, you will receive your results only by: ?MyChart Message (if you have MyChart) OR ?A paper copy in the mail ?If you have any lab test that is abnormal or we need to change your treatment, we will call you to review the results. ? ? ?Testing/Procedures: ?Your physician has requested that you have an ankle brachial index (ABI). During this test an ultrasound and blood pressure cuff are used to evaluate the arteries that supply the arms and legs with blood. Allow thirty minutes for this exam. There are no restrictions or special instructions.  ? ?Your physician has requested that you have a lower extremity arterial duplex. This test is an ultrasound of the arteries in the legs. It looks at arterial blood flow in the legs. There are no restrictions or special instructions  ? ? ?Follow-Up: ?At [facility], you and your health needs are our priority.  As part of our continuing mission to provide you with exceptional heart care, we have created designated Provider Care Teams.  These Care Teams include your primary Cardiologist (physician) and Advanced Practice Providers (APPs -  Physician Assistants and Nurse Practitioners) who all work together to provide you with the care you need, when you need it. ?  ? ?Your next appointment:   ?6 month(s) ? ?The format for your next appointment:   ?In Person ? ?Provider:   ?Werner Lean, MD   ? ?

## 2021-06-05 ENCOUNTER — Ambulatory Visit (HOSPITAL_COMMUNITY)
Admission: RE | Admit: 2021-06-05 | Discharge: 2021-06-05 | Disposition: A | Discharge status: Home / Self Care | Payer: 59 | Source: Ambulatory Visit | Attending: Cardiovascular Disease | Admitting: Cardiovascular Disease

## 2021-06-05 DIAGNOSIS — I739 Peripheral vascular disease, unspecified: Secondary | ICD-10-CM | POA: Insufficient documentation

## 2021-06-06 ENCOUNTER — Encounter: Payer: Self-pay | Admitting: Emergency Medicine

## 2021-06-06 ENCOUNTER — Ambulatory Visit (INDEPENDENT_AMBULATORY_CARE_PROVIDER_SITE_OTHER): Payer: 59 | Admitting: Emergency Medicine

## 2021-06-06 ENCOUNTER — Other Ambulatory Visit: Payer: Self-pay | Admitting: Family

## 2021-06-06 DIAGNOSIS — Z720 Tobacco use: Secondary | ICD-10-CM

## 2021-06-06 DIAGNOSIS — R519 Headache, unspecified: Secondary | ICD-10-CM

## 2021-06-06 DIAGNOSIS — R053 Chronic cough: Secondary | ICD-10-CM | POA: Insufficient documentation

## 2021-06-06 MED ORDER — PREDNISONE 10 MG PO TABS: 20.0000 mg | ORAL_TABLET | Freq: Every day | ORAL | 0 refills | Status: AC

## 2021-06-06 MED ORDER — LORATADINE 10 MG PO TABS
10.0000 mg | ORAL_TABLET | Freq: Every day | ORAL | 11 refills | Status: DC
Start: 2021-06-06 — End: 2021-07-26

## 2021-06-06 MED ORDER — ALBUTEROL SULFATE HFA 108 (90 BASE) MCG/ACT IN AERS: 2.0000 | INHALATION_SPRAY | Freq: Four times a day (QID) | RESPIRATORY_TRACT | 6 refills | Status: DC | PRN

## 2021-06-06 NOTE — Patient Instructions (Signed)
We will perform pulmonary function testing at your next office visit. ?We will arrange for your lung cancer screening CT scan of the chest to compare with priors. ?Please start taking your omeprazole 20 mg twice a day for the next 2 weeks, then decrease to once a day until you follow-up in this office.  Take this medication 1 hour around food. ?Please start loratadine 10 mg (generic Claritin) once daily until next visit. ?Once you are on the omeprazole and the loratadine, please take prednisone as directed for 5 days until completely gone. ?We will give you a prescription for albuterol.  This is an inhaler that you can use 2 puffs up to every 4 hours if needed for shortness of breath, chest tightness, wheezing, coughing and mucus production ?Depending on your pulmonary function testing we may decide to consider an every day scheduled inhaler in the future. ?Congratulations on decreasing your cigarettes.  Ultimate goal will be to stop altogether.  We will try to help you with this. ?Follow with Dr. Lamonte Sakai next available with full pulmonary function testing on the same day.  ? ?

## 2021-06-06 NOTE — Progress Notes (Signed)
? ?Subjective:  ? ? Patient ID: Laura Sloan, female    DOB: 1955-12-15, 66 y.o.   MRN: [id number] ? ?HPI ?66 year old active smoker (70 pack years) with a history of hep C and cirrhosis, GERD, seasonal allergies, arthritis, MGUS.  She is referred today for evaluation of dyspnea, chest congestion in the setting of tobacco use. ? ?She was well until about 01/2021 when she had a URI (COVID negative), led to a lot of cough, nasal congestion, chest congestion. Continued to have a cough. May be moving some mucous, but not enough to clear it. She feels some nasal drainage. No SOB. Good functional capacity. She has GERD, uses omeprazole prn. Uses OTC decongestants.  ? ?Lung cancer screening CT chest 05/04/2020 reviewed by me showed some mild biapical pleural-parenchymal scarring, emphysematous change, 2 subpleural 5 mm nodules at the right minor fissure.  Lung RADS 2 ? ? ?Review of Systems ?As per HPI ? ?Past Medical History:  ?Diagnosis Date  ? Anxiety   ? Arthritis   ? 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 ([place]) 05/2020  ? followed by ID, Janene Madeira NP,  dx 03/ 2022,  hx IV drug use 1980s  ? Cirrhosis of liver ([place]) 05/2020  ? Family history of adverse reaction to anesthesia   ? mother-- hard to wake  ? Full dentures   ? GERD (gastroesophageal reflux disease)   ? Hepatitis B core antibody positive   ? Seasonal allergies   ? Smokers' cough ([place])   ? Vulvar dysplasia   ? Wears glasses   ?  ? ?Family History  ?Problem Relation Age of Onset  ? Colon polyps Mother   ? Cancer Father   ?     kidney cancer  ? Neuropathy Father   ? Cancer Brother   ?     throat cancer  ? Esophageal cancer Brother   ? Ovarian cancer Neg Hx   ? Endometrial cancer Neg Hx   ? Breast cancer Neg Hx   ? Prostate cancer Neg Hx   ? Pancreatic cancer Neg Hx   ? Colon cancer Neg Hx   ? Stomach cancer Neg Hx   ? Rectal cancer Neg Hx   ?  ? ?Social History  ? ?Socioeconomic History  ? Marital status: Divorced  ?  Spouse name: Not on file  ?  Number of children: Not on file  ? Years of education: Not on file  ? Highest education level: Not on file  ?Occupational History  ? Occupation: retired  ?Tobacco Use  ? Smoking status: Every Day  ?  Years: 49.00  ?  Types: Cigarettes  ?  Start date: 05/13/1971  ? Smokeless tobacco: Never  ? Tobacco comments:  ?  Smokes 1 pack a week. ARJ 06/06/21  ?Vaping Use  ? Vaping Use: Former  ? Quit date: 07/14/2015  ?Substance and Sexual Activity  ? Alcohol use: Not Currently  ?  Comment: due to the medication for liver disease  ? Drug use: Not Currently  ?  Types: Cocaine  ?  Comment: 07-13-2020  pt stated last drug use since  11-12-2005;  hx IV drug use 1980s  ? Sexual activity: Not Currently  ?  Birth control/protection: Post-menopausal  ?  Comment: Patient in relationship but no sexual activity in 8 years.  ?Other Topics Concern  ? Not on file  ?Social History Narrative  ? Caffeine - everyday 3 cups in am (coffee, and icd tea) no sodas.  Education:  GED, Working Materials engineer.    ? ?Social Determinants of Health  ? ?Financial Resource Strain: Not on file  ?Food Insecurity: Not on file  ?Transportation Needs: Not on file  ?Physical Activity: Not on file  ?Stress: Not on file  ?Social Connections: Not on file  ?Intimate Partner Violence: Not on file  ?  ? ?No Known Allergies  ? ?Outpatient Medications Prior to Visit  ?Medication Sig Dispense Refill  ? aspirin EC 81 MG tablet Take 1 tablet (81 mg total) by mouth daily. Swallow whole. 30 tablet 11  ? furosemide (LASIX) 20 MG tablet Take 20 mg by mouth daily as needed (swelling).    ? gabapentin (NEURONTIN) 300 MG capsule Take 300-600 mg by mouth at bedtime.    ? hydrOXYzine (ATARAX/VISTARIL) 25 MG tablet TAKE 1 TABLET BY MOUTH 3 TIMES DAILY AS NEEDED FOR ITCHING. 270 tablet 3  ? Multiple Vitamins-Minerals (MULTIVITAMIN WITH MINERALS) tablet Take 1 tablet by mouth daily.    ? omeprazole (PRILOSEC) 20 MG capsule TAKE 1 CAPSULE BY MOUTH EVERY DAY 90 capsule 0  ? POTASSIUM PO Take 1  tablet by mouth daily as needed (when you take lasix).    ? sertraline (ZOLOFT) 25 MG tablet TAKE 1 TABLET (25 MG TOTAL) BY MOUTH DAILY. 90 tablet 0  ? simvastatin (ZOCOR) 40 MG tablet Take 1 tablet (40 mg total) by mouth at bedtime. 90 tablet 3  ? SUMAtriptan (IMITREX) 25 MG tablet TAKE 25 MG (1 TABLET) BY MOUTH AT THE START OF THE HEADACHE. MAY REPEAT IN 2 HOURS X 1 IF HEADACHE PERSISTS. MAX OF 2 TABS/24 HOURS. 20 tablet 1  ? vitamin B-12 (CYANOCOBALAMIN) 500 MCG tablet Take 1,000 mcg by mouth daily.    ? Vitamin D, Ergocalciferol, (DRISDOL) 1.25 MG (50000 UNIT) CAPS capsule TAKE 1 CAPSULE (50,000 UNITS TOTAL) BY MOUTH EVERY 7 (SEVEN) DAYS 12 capsule 4  ? pyridoxine (B-6) 100 MG tablet Take 100 mg by mouth daily.    ? ?No facility-administered medications prior to visit.  ? ? ? ? ?   ?Objective:  ? Physical Exam ?Vitals:  ? 06/06/21 1020  ?BP: 128/76  ?Pulse: 61  ?Temp: 98.2 ?F (36.8 ?C)  ?TempSrc: Oral  ?SpO2: 95%  ?Weight: 145 lb 6.4 oz (66 kg)  ?Height: '5\' 6"'$  (1.676 m)  ? ?Gen: Pleasant, well-nourished, in no distress,  normal affect ? ?ENT: No lesions,  mouth clear,  oropharynx clear, no postnasal drip ? ?Neck: No JVD, no stridor ? ?Lungs: No use of accessory muscles, no crackles or wheezing on normal respiration, she does have some cough but no wheeze on forced expiration ? ?Cardiovascular: RRR, heart sounds normal, no murmur or gallops, no peripheral edema ? ?Musculoskeletal: No deformities, no cyanosis or clubbing ? ?Neuro: alert, awake, non focal ? ?Skin: Warm, no lesions or rash ? ? ?   ?Assessment & Plan:  ?Chronic cough ?Cough that is minimally productive, started following an index URI in November.  She denies any dyspnea although there may be a component of her presumed COPD here.  We will try to treat potential exacerbating factors including GERD, rhinitis.  I will give her a short course of prednisone to try to resolve the cough.  She will have pulmonary function testing to quantify degree of  obstruction.  She is due for her lung cancer screening CT which will allow [place] to evaluate parenchyma for any abnormalities that would be contributing to cough. ? ?Tobacco use ?No dyspnea but she does  have cough, chest mucus.  She needs pulmonary function testing to quantify degree of obstruction.  I will give her an albuterol inhaler to use as needed.  Suspect she will be diagnosed with COPD and may benefit from scheduled BD therapy.  She understands that she needs to stop smoking and is working on this.  We will try to help her.  She has cut down.  She is due for her lung cancer screening CT scan of the chest, last was done 05/04/2020, RADS 2. ? ?Baltazar Apo, MD, PhD ?06/06/2021, 11:05 AM ?[facility] ?[phone] or if no answer before 7:00PM call [phone] ?For any issues after 7:00PM please call eLink [phone] ? ? ?

## 2021-06-06 NOTE — Assessment & Plan Note (Signed)
No dyspnea but she does have cough, chest mucus.  She needs pulmonary function testing to quantify degree of obstruction.  I will give her an albuterol inhaler to use as needed.  Suspect she will be diagnosed with COPD and may benefit from scheduled BD therapy.  She understands that she needs to stop smoking and is working on this.  We will try to help her.  She has cut down.  She is due for her lung cancer screening CT scan of the chest, last was done 05/04/2020, RADS 2. ?

## 2021-06-06 NOTE — Assessment & Plan Note (Signed)
Cough that is minimally productive, started following an index URI in November.  She denies any dyspnea although there may be a component of her presumed COPD here.  We will try to treat potential exacerbating factors including GERD, rhinitis.  I will give her a short course of prednisone to try to resolve the cough.  She will have pulmonary function testing to quantify degree of obstruction.  She is due for her lung cancer screening CT which will allow [place] to evaluate parenchyma for any abnormalities that would be contributing to cough. ?

## 2021-06-12 ENCOUNTER — Other Ambulatory Visit: Payer: Self-pay | Admitting: *Deleted

## 2021-06-12 DIAGNOSIS — F17210 Nicotine dependence, cigarettes, uncomplicated: Secondary | ICD-10-CM

## 2021-06-12 DIAGNOSIS — Z122 Encounter for screening for malignant neoplasm of respiratory organs: Secondary | ICD-10-CM

## 2021-06-12 DIAGNOSIS — Z87891 Personal history of nicotine dependence: Secondary | ICD-10-CM

## 2021-06-27 ENCOUNTER — Ambulatory Visit (INDEPENDENT_AMBULATORY_CARE_PROVIDER_SITE_OTHER)
Admission: RE | Admit: 2021-06-27 | Discharge: 2021-06-27 | Disposition: A | Discharge status: Home / Self Care | Payer: 59 | Source: Ambulatory Visit | Attending: Emergency Medicine | Admitting: Emergency Medicine

## 2021-06-27 DIAGNOSIS — F17210 Nicotine dependence, cigarettes, uncomplicated: Secondary | ICD-10-CM

## 2021-06-27 DIAGNOSIS — Z87891 Personal history of nicotine dependence: Secondary | ICD-10-CM

## 2021-06-27 DIAGNOSIS — Z122 Encounter for screening for malignant neoplasm of respiratory organs: Secondary | ICD-10-CM

## 2021-06-28 ENCOUNTER — Other Ambulatory Visit: Payer: 59

## 2021-06-28 ENCOUNTER — Other Ambulatory Visit: Payer: Self-pay | Admitting: Internal Medicine

## 2021-06-28 LAB — HEPATIC FUNCTION PANEL
ALT: 56 IU/L — ABNORMAL HIGH (ref 0–32)
AST: 65 IU/L — ABNORMAL HIGH (ref 0–40)
Albumin: 4.4 g/dL (ref 3.8–4.8)
Alkaline Phosphatase: 123 IU/L — ABNORMAL HIGH (ref 44–121)
Bilirubin Total: 0.5 mg/dL (ref 0.0–1.2)
Bilirubin, Direct: 0.18 mg/dL (ref 0.00–0.40)
Total Protein: 7.7 g/dL (ref 6.0–8.5)

## 2021-06-28 LAB — CBC
Hematocrit: 44.7 % (ref 34.0–46.6)
Hemoglobin: 15.5 g/dL (ref 11.1–15.9)
MCH: 30.3 pg (ref 26.6–33.0)
MCHC: 34.7 g/dL (ref 31.5–35.7)
MCV: 87 fL (ref 79–97)
Platelets: 119 10*3/uL — ABNORMAL LOW (ref 150–450)
RBC: 5.12 x10E6/uL (ref 3.77–5.28)
RDW: 12.4 % (ref 11.7–15.4)
WBC: 4.7 10*3/uL (ref 3.4–10.8)

## 2021-06-28 LAB — LIPID PANEL
Chol/HDL Ratio: 3.5 ratio (ref 0.0–4.4)
Cholesterol, Total: 153 mg/dL (ref 100–199)
HDL: 44 mg/dL (ref >39)
LDL Chol Calc (NIH): 90 mg/dL (ref 0–99)
Triglycerides: 102 mg/dL (ref 0–149)
VLDL Cholesterol Cal: 19 mg/dL (ref 5–40)

## 2021-07-02 ENCOUNTER — Ambulatory Visit: Payer: 59 | Admitting: Infectious Diseases

## 2021-07-03 ENCOUNTER — Other Ambulatory Visit: Payer: Self-pay

## 2021-07-03 ENCOUNTER — Ambulatory Visit (INDEPENDENT_AMBULATORY_CARE_PROVIDER_SITE_OTHER): Payer: 59 | Admitting: Infectious Diseases

## 2021-07-03 ENCOUNTER — Encounter: Payer: Self-pay | Admitting: Infectious Diseases

## 2021-07-03 ENCOUNTER — Encounter: Payer: Self-pay | Admitting: Internal Medicine

## 2021-07-03 ENCOUNTER — Other Ambulatory Visit: Payer: Self-pay | Admitting: Family

## 2021-07-03 VITALS — BP 116/67 | HR 67 | Temp 98.4°F | Wt 145.0 lb

## 2021-07-03 DIAGNOSIS — B1920 Unspecified viral hepatitis C without hepatic coma: Secondary | ICD-10-CM

## 2021-07-03 DIAGNOSIS — I700 Atherosclerosis of aorta: Secondary | ICD-10-CM

## 2021-07-03 DIAGNOSIS — L989 Disorder of the skin and subcutaneous tissue, unspecified: Secondary | ICD-10-CM

## 2021-07-03 DIAGNOSIS — F419 Anxiety disorder, unspecified: Secondary | ICD-10-CM

## 2021-07-03 DIAGNOSIS — Z8619 Personal history of other infectious and parasitic diseases: Secondary | ICD-10-CM | POA: Diagnosis not present

## 2021-07-03 DIAGNOSIS — K7469 Other cirrhosis of liver: Secondary | ICD-10-CM | POA: Diagnosis not present

## 2021-07-03 DIAGNOSIS — I2510 Atherosclerotic heart disease of native coronary artery without angina pectoris: Secondary | ICD-10-CM

## 2021-07-03 NOTE — Assessment & Plan Note (Signed)
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 Continue topical steroids as these seem to be helpful for symptom management.  ?

## 2021-07-03 NOTE — Assessment & Plan Note (Signed)
Effectively eradicated with 12 week SVR labs in October.  ?

## 2021-07-03 NOTE — Progress Notes (Signed)
Lab appointment for LFT scheduled for 07/17/21.  ?

## 2021-07-03 NOTE — Progress Notes (Signed)
? ?Patient Name: Laura Sloan  ?Date of Birth: 1955/12/30  ?MRN: [id number]  ?PCP: Camillia Herter, NP  ?Referring Provider: Camillia Herter, NP, Ph#: [phone] ? ? ?CC:  ?Hepatitis C follow up ? ? ? ?HPI:  ?Laura Sloan is a 66 y.o. female with chronic hepatitis C infection due to genotype 1a. Fibrotest and Elastography indicate cirrhosis/F4; Child Pugh A, compensated disease.  ? ?[name] Start: 06/20/2020 ?Epclusa End: 09/11/2020 ? ?Hep C RNAs:  ?05/12/2020 - 5,020,000 ?08/01/2020 - not detected; LFTs improved.  ?09/25/2020 - not detected  ?12/2020 - not detected (SVR visit)  ? ? ? ?HPI: ?She feels much better compared to when I first started working with her. Down 10# since last visit - not necessarily intentional but as a result of more energy, less alcohol and increased activity.  ? ?Now seeing a cardiologist and pulmonologist. Started on a statin recently due to increased coronary artery calcifications seen on CT scan recently; LFTs did rise as a result of this. Plan is to return in 1 month for repeated testing  ? ?Burning in the feet has been improved with the vitamin d and 12 supplementation but not resolved and waxes/wanes.  ?Takes lasix occasionally, no more than 1x a month.  ?EGD without any varices - recommend to repeat in 2-3 years.  ? ?Last U/S 10/2020 - negative screen for HCC. Has an appt with GI November 7th. Has only had alcohol 3 times in the last 3 months.  ? ? ? ?Review of Systems  ?Constitutional:  Positive for unexpected weight change. Negative for appetite change and fatigue.  ?Respiratory:  Positive for cough.   ?Cardiovascular:  Leg swelling: intermittently.  ?Gastrointestinal:  Negative for abdominal pain and anal bleeding.  ?Musculoskeletal:  Negative for arthralgias.  ?Skin:  Positive for rash.  ?Neurological:  Positive for numbness.  ? ?   ? ?Past Medical History:  ?Diagnosis Date  ? Anxiety   ? Arthritis   ? 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 ([place]) 05/2020  ?  followed by ID, Janene Madeira NP,  dx 03/ 2022,  hx IV drug use 1980s  ? Cirrhosis of liver ([place]) 05/2020  ? Family history of adverse reaction to anesthesia   ? mother-- hard to wake  ? Full dentures   ? GERD (gastroesophageal reflux disease)   ? Hepatitis B core antibody positive   ? Seasonal allergies   ? Smokers' cough ([place])   ? Vulvar dysplasia   ? Wears glasses   ? ? ?Prior to Admission medications   ?Medication Sig Start Date End Date Taking? Authorizing Provider  ?buPROPion (WELLBUTRIN SR) 150 MG 12 hr tablet Take 1 tablet (150 mg total) by mouth daily for 3 days, THEN 1 tablet (150 mg total) 2 (two) times daily for 27 days. 05/05/20 06/04/20  Camillia Herter, NP  ?omeprazole (PRILOSEC) 20 MG capsule Take 1 capsule (20 mg total) by mouth daily. 05/03/20   Camillia Herter, NP  ?polyethylene glycol (MIRALAX) 17 g packet Take 17 g by mouth daily. 05/03/20   Camillia Herter, NP  ? ? ?No Known Allergies ? ?Social History  ? ?Tobacco Use  ? Smoking status: Every Day  ?  Years: 49.00  ?  Types: Cigarettes  ?  Start date: 05/13/1971  ? Smokeless tobacco: Never  ? Tobacco comments:  ?  Smokes 1 pack a week. ARJ 06/06/21  ?Vaping Use  ? Vaping Use: Former  ? Quit date: 07/14/2015  ?  Substance Use Topics  ? Alcohol use: Not Currently  ?  Comment: due to the medication for liver disease  ? Drug use: Not Currently  ?  Types: Cocaine  ?  Comment: 07-13-2020  pt stated last drug use since  11-12-2005;  hx IV drug use 1980s  ? ? ? ? ?Objective:  ? ?Vitals:  ? 07/03/21 0923  ?BP: 116/67  ?Pulse: 67  ?Temp: 98.4 ?F (36.9 ?C)  ? ?Body mass index is 23.4 kg/m?. ? ? ?Constitutional: in no apparent distress and well developed and well nourished ?Eyes: anicteric ?Cardiovascular: Cor RRR and No murmurs ?Respiratory: clear ?Gastrointestinal: Bowel sounds are normal, liver is not enlarged, spleen is not enlarged ?Musculoskeletal: peripheral pulses normal, no clubbing or cyanosis ?Skin: painful red macules overlying anterior shins  ?Lymphatic:  no lymphadenopathy ? ? ? ?Laboratory: ?Genotype:  ?Lab Results  ?Component Value Date  ? HCVGENOTYPE 1a 05/12/2020  ? ?HCV viral load: No results found for: HCVQUANT ?Lab Results  ?Component Value Date  ? WBC 4.7 06/28/2021  ? HGB 15.5 06/28/2021  ? HCT 44.7 06/28/2021  ? MCV 87 06/28/2021  ? PLT 119 (L) 06/28/2021  ?  ?Lab Results  ?Component Value Date  ? CREATININE 0.84 01/08/2021  ? BUN 19 01/08/2021  ? NA 137 01/08/2021  ? K 4.3 01/08/2021  ? CL 101 01/08/2021  ? CO2 28 01/08/2021  ?  ?Lab Results  ?Component Value Date  ? ALT 56 (H) 06/28/2021  ? AST 65 (H) 06/28/2021  ? GGT 106 (H) 05/12/2020  ? ALKPHOS 123 (H) 06/28/2021  ?  ?Lab Results  ?Component Value Date  ? INR 1.2 (H) 01/08/2021  ? BILITOT 0.5 06/28/2021  ? ALBUMIN 4.4 06/28/2021  ? ? ? ?Imaging:  ?05-2020:  The liver has a heterogeneous and nodular contour consistent with ?cirrhosis. No focal liver abnormality. Elastography highly suggestive of compensated liver disease. (kPcal >14).  ? ?10-2020: stable appearing cirrhosis w/o tumor/mass effect. No ascites  ? ? ? ?Assessment & Plan:  ? ?Problem List Items Addressed This Visit   ? ?  ? Unprioritized  ? Hepatic cirrhosis ([place]) - Primary  ?  Screening EGD negative for varicies - rec follow up with Dr. Silverio Decamp in 3 years to repeat. Discussed rationale for ongoing screening with Q69mU/S and AFP and these variceal screening. She has well compensated cirrhosis. We discussed possible signs of progression of her condition. I think it is valuable to continue to working with PCP and GI team with ongoing screenings. Will arrange for another [place] in August + AFP draw and review over telehealth visit.  ? ?Elevated LFTs after initiation of statin - FU with cardiology noted with plans for med change if persistent.  ? ?  ?  ? Hepatitis C virus infection cured after antiviral drug therapy  ?  Effectively eradicated with 12 week SVR labs in October.  ? ?  ?  ? Skin abnormalities  ?  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 Continue topical steroids as these seem to be helpful for symptom management.  ? ?  ?  ? ?SJanene Madeira MSN, NP-C ?R[facility]ernfor Infectious Disease ?[facility]  ?[email or web address] ?Pager: [phone]?Office: [phone]?[facility] Main Line: [phone]? ?

## 2021-07-03 NOTE — Patient Instructions (Addendum)
Nice to see you! ? ?For primary care I think you would get along with Dr. Billey Gosling or Dr. Pricilla Holm. Call for a new patient visit if you are interested.  ?Address: [address] [place], [place] [address] ?Phone: [phone] ? ?I would get a scale to monitor just once a week to make sure weight stays stable.  ? ?Dr. Juliann Mule recommended repeating your EGD in 2-3 years and to follow up with them in 6 months.  ? ?Will get your next ultrasound and labs set up in August - we can call you for this. Telephone visit after so we can review (or you can come in person if you prefer - up to you!) ?

## 2021-07-03 NOTE — Assessment & Plan Note (Addendum)
Screening EGD negative for varicies - rec follow up with Dr. Silverio Decamp in 3 years to repeat. Discussed rationale for ongoing screening with Q97mU/S and AFP and these variceal screening. She has well compensated cirrhosis. We discussed possible signs of progression of her condition. I think it is valuable to continue to working with PCP and GI team with ongoing screenings. Will arrange for another [place]creen in August + AFP draw and review over telehealth visit.  ? ?Elevated LFTs after initiation of statin - FU with cardiology noted with plans for med change if persistent.  ?

## 2021-07-03 NOTE — Telephone Encounter (Signed)
-----   Message from Werner Lean, MD sent at 07/01/2021  8:20 PM EDT ----- ?Results: ?Stable platelets ?Slight increase in LFTs ?Improvemed LDL ?Plan: ?LFT's in two weeks, if continuing to rise will attempt nont staitn therapy ? ?Werner Lean, MD ? ?

## 2021-07-04 NOTE — Telephone Encounter (Signed)
Requested Prescriptions  ?Pending Prescriptions Disp Refills  ?? sertraline (ZOLOFT) 25 MG tablet [Pharmacy Med Name: SERTRALINE HCL 25 MG TABLET] 90 tablet 0  ?  Sig: TAKE 1 TABLET (25 MG TOTAL) BY MOUTH DAILY.  ?  ? Psychiatry:  Antidepressants - SSRI - sertraline Failed - 07/03/2021 10:30 PM  ?  ?  Failed - AST in normal range and within 360 days  ?  AST  ?Date Value Ref Range Status  ?06/28/2021 65 (H) 0 - 40 IU/L Final  ?12/28/2020 53 (H) 15 - 41 U/L Final  ?   ?  ?  Failed - ALT in normal range and within 360 days  ?  ALT  ?Date Value Ref Range Status  ?06/28/2021 56 (H) 0 - 32 IU/L Final  ?12/28/2020 68 (H) 0 - 44 U/L Final  ?05/12/2020 112 (H) 6 - 29 U/L Final  ?   ?  ?  Passed - Completed PHQ-2 or PHQ-9 in the last 360 days  ?  ?  Passed - Valid encounter within last 6 months  ?  Recent Outpatient Visits   ?      ? 2 months ago Anxiety and depression  ? Primary Care at [facility], Amy J, NP  ? 5 months ago Anxiety and depression  ? Primary Care at [facility], Amy J, NP  ? 6 months ago Encounter for smoking cessation counseling  ? Primary Care at [facility], Amy J, NP  ? 7 months ago Screening cholesterol level  ? Primary Care at [facility], [place], NP  ? 1 year ago Annual physical exam  ? Primary Care at [facility], Flonnie Hailstone, NP  ?  ?  ?Future Appointments   ?        ? In 3 weeks Byrum, Rose Fillers, MD [facility]  ? In 4 months Chandrasekhar, Terisa Starr, MD [email or web address]  ?  ? ?  ?  ?  ? ?

## 2021-07-17 ENCOUNTER — Other Ambulatory Visit: Payer: 59 | Admitting: *Deleted

## 2021-07-17 DIAGNOSIS — I700 Atherosclerosis of aorta: Secondary | ICD-10-CM

## 2021-07-17 DIAGNOSIS — I2510 Atherosclerotic heart disease of native coronary artery without angina pectoris: Secondary | ICD-10-CM

## 2021-07-17 DIAGNOSIS — K7469 Other cirrhosis of liver: Secondary | ICD-10-CM

## 2021-07-17 LAB — HEPATIC FUNCTION PANEL
ALT: 50 IU/L — ABNORMAL HIGH (ref 0–32)
AST: 51 IU/L — ABNORMAL HIGH (ref 0–40)
Albumin: 4.4 g/dL (ref 3.8–4.8)
Alkaline Phosphatase: 130 IU/L — ABNORMAL HIGH (ref 44–121)
Bilirubin Total: 0.5 mg/dL (ref 0.0–1.2)
Bilirubin, Direct: 0.17 mg/dL (ref 0.00–0.40)
Total Protein: 7.6 g/dL (ref 6.0–8.5)

## 2021-07-26 ENCOUNTER — Ambulatory Visit (INDEPENDENT_AMBULATORY_CARE_PROVIDER_SITE_OTHER): Payer: 59 | Admitting: Emergency Medicine

## 2021-07-26 ENCOUNTER — Encounter: Payer: Self-pay | Admitting: Emergency Medicine

## 2021-07-26 DIAGNOSIS — J449 Chronic obstructive pulmonary disease, unspecified: Secondary | ICD-10-CM

## 2021-07-26 DIAGNOSIS — R053 Chronic cough: Secondary | ICD-10-CM

## 2021-07-26 DIAGNOSIS — Z720 Tobacco use: Secondary | ICD-10-CM

## 2021-07-26 LAB — PULMONARY FUNCTION TEST
DL/VA % pred: 75 %
DL/VA: 3.11 ml/min/mmHg/L
DLCO cor % pred: 80 %
DLCO cor: 17.12 ml/min/mmHg
DLCO unc % pred: 84 %
DLCO unc: 18.14 ml/min/mmHg
FEF 25-75 Post: 1.81 L/sec
FEF 25-75 Pre: 2.01 L/sec
FEF2575-%Change-Post: -9 %
FEF2575-%Pred-Post: 77 %
FEF2575-%Pred-Pre: 85 %
FEV1-%Change-Post: -2 %
FEV1-%Pred-Post: 95 %
FEV1-%Pred-Pre: 98 %
FEV1-Post: 2.55 L
FEV1-Pre: 2.62 L
FEV1FVC-%Change-Post: -3 %
FEV1FVC-%Pred-Pre: 95 %
FEV6-%Change-Post: 0 %
FEV6-%Pred-Post: 106 %
FEV6-%Pred-Pre: 105 %
FEV6-Post: 3.55 L
FEV6-Pre: 3.53 L
FEV6FVC-%Change-Post: 0 %
FEV6FVC-%Pred-Post: 102 %
FEV6FVC-%Pred-Pre: 103 %
FVC-%Change-Post: 0 %
FVC-%Pred-Post: 103 %
FVC-%Pred-Pre: 102 %
FVC-Post: 3.58 L
FVC-Pre: 3.55 L
Post FEV1/FVC ratio: 71 %
Post FEV6/FVC ratio: 99 %
Pre FEV1/FVC ratio: 74 %
Pre FEV6/FVC Ratio: 99 %
RV % pred: 91 %
RV: 1.96 L
TLC % pred: 102 %
TLC: 5.47 L

## 2021-07-26 MED ORDER — LEVOCETIRIZINE DIHYDROCHLORIDE 5 MG PO TABS
5.0000 mg | ORAL_TABLET | Freq: Every evening | ORAL | 5 refills | Status: DC
Start: 2021-07-26 — End: 2022-01-10

## 2021-07-26 MED ORDER — FLUTICASONE PROPIONATE 50 MCG/ACT NA SUSP: 2.0000 | Freq: Every day | NASAL | 3 refills | Status: AC

## 2021-07-26 NOTE — Assessment & Plan Note (Signed)
Continues to smoke.  She continues to get pleasure from this and really is not motivated to stop.  We will have to continue to speak with her going forward, see if we can get to a point where stopping can be a goal.  For now I tried to encourage her to cut down.  We can set some future goals to decrease as we go forward.

## 2021-07-26 NOTE — Assessment & Plan Note (Addendum)
Persistent and very bothersome.  She has not gotten much impact from better control of her GERD (although her reflux is better).  She remains on omeprazole once daily.  She does feel that she has congestion and drainage.  She tried the loratadine and it dried her out.  I think it would be reasonable to try Xyzal, fluticasone nasal spray to see if we control rhinitis more effectively.  Certainly contributor is her tobacco use.  We talked about this today and she is frustrated because she has not been able to stop, really does not want to stop.  We will have to work on it further going forward.  Her imaging is reassuring.  If she continues to have cough then she probably needs an airway inspection by bronchoscopy.  Briefly touched on this today

## 2021-07-26 NOTE — Patient Instructions (Signed)
Full PFT Performed Today  

## 2021-07-26 NOTE — Progress Notes (Signed)
Full PFT Performed Today  

## 2021-07-26 NOTE — Assessment & Plan Note (Signed)
Mild COPD based on spirometry today.  Her principal symptom is cough and unclear to me whether this reflects bronchospasm or lower airways disease.  She has albuterol which has not helped the cough.  I do not think we need to start a scheduled medication right now although could consider ICS/LABA in the future given the chronic bronchitic phenotype.  We will follow

## 2021-07-26 NOTE — Patient Instructions (Signed)
We reviewed your pulmonary function testing today. Keep albuterol available to use 2 puffs if needed for shortness of breath, chest tightness, coughing or wheezing. We will hold off on starting any scheduled inhaler medication for now. Try starting Xyzal once daily. Try starting fluticasone nasal spray, 2 sprays each nostril once daily. Continue your omeprazole once daily for now.  After you are on the allergy medications above for 2 to 3 weeks, you could consider stopping the omeprazole depending on how your cough is doing. We will continue to work on decreasing your cigarettes. We reviewed your CT scan of the chest today.  This is stable compared with your prior.  You will need a repeat screening CT scan in 1 year. Follow with Dr Lamonte Sakai in 6 months or sooner if you have any problems

## 2021-07-26 NOTE — Progress Notes (Signed)
Subjective:    Patient ID: Laura Sloan, female    DOB: February 01, 1956, 66 y.o.   MRN: [id number]  HPI 66 year old active smoker (70 pack years) with a history of hep C and cirrhosis, GERD, seasonal allergies, arthritis, MGUS.  She is referred today for evaluation of dyspnea, chest congestion in the setting of tobacco use.   She was well until about 01/2021 when she had a URI (COVID negative), led to a lot of cough, nasal congestion, chest congestion. Continued to have a cough. May be moving some mucous, but not enough to clear it. She feels some nasal drainage. No SOB. Good functional capacity. She has GERD, uses omeprazole prn. Uses OTC decongestants.   Lung cancer screening CT chest 05/04/2020 reviewed by me showed some mild biapical pleural-parenchymal scarring, emphysematous change, 2 subpleural 5 mm nodules at the right minor fissure.  Lung RADS 2   ROV 07/26/21 --follow-up visit for 66 year old woman with a history of active tobacco use (70 pack years), hepatitis C with cirrhosis, GERD, seasonal allergies, MGUS, arthritis.  I saw her in April for congestion, cough, dyspnea.  I gave her a short course of prednisone to see if I could impact her cough, started omeprazole, loratadine.  She reports that her cough continues, did not really seem to be impacted much by the prednisone, but she had a URI in the interim. The omeprazole didn't seem to change anything about the cough. Her GERD is better. She took loraradine, dried her out.  We performed pulmonary function testing today as below.  I gave her albuterol to try, she reports  She continues to smoke - admits that she really doesn't want to quit. She is smoking 10 cig a day.   Pulmonary function testing performed today and reviewed by me shows mild obstruction without a bronchodilator response, normal lung volumes, normal diffusion capacity.  Lung cancer screening CT 06/27/2021 reviewed by me shows centrilobular emphysema, mild left base  scarring, stable peri-fissural nodules, largest 6 mm.  RADS 2 study   Review of Systems As per HPI  Past Medical History:  Diagnosis Date   Anxiety    Arthritis    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 ([place]) 05/2020   followed by ID, Janene Madeira NP,  dx 03/ 2022,  hx IV drug use 1980s   Cirrhosis of liver ([place]) 05/2020   Family history of adverse reaction to anesthesia    mother-- hard to wake   Full dentures    GERD (gastroesophageal reflux disease)    Hepatitis B core antibody positive    Seasonal allergies    Smokers' cough ([place])    Vulvar dysplasia    Wears glasses      Family History  Problem Relation Age of Onset   Colon polyps Mother    Cancer Father        kidney cancer   Neuropathy Father    Cancer Brother        throat cancer   Esophageal cancer Brother    Ovarian cancer Neg Hx    Endometrial cancer Neg Hx    Breast cancer Neg Hx    Prostate cancer Neg Hx    Pancreatic cancer Neg Hx    Colon cancer Neg Hx    Stomach cancer Neg Hx    Rectal cancer Neg Hx      Social History   Socioeconomic History   Marital status: Divorced    Spouse name: Not on file   Number  of children: Not on file   Years of education: Not on file   Highest education level: Not on file  Occupational History   Occupation: retired  Tobacco Use   Smoking status: Every Day    Years: 49.00    Types: Cigarettes    Start date: 05/13/1971   Smokeless tobacco: Never   Tobacco comments:    Smokes 0.5 pack a day. ARJ 07/26/21  Vaping Use   Vaping Use: Former   Quit date: 07/14/2015  Substance and Sexual Activity   Alcohol use: Not Currently    Comment: due to the medication for liver disease   Drug use: Not Currently    Types: Cocaine    Comment: 07-13-2020  pt stated last drug use since  11-12-2005;  hx IV drug use 1980s   Sexual activity: Not Currently    Birth control/protection: Post-menopausal    Comment: Patient in relationship but no sexual activity in 8  years.  Other Topics Concern   Not on file  Social History Narrative   Caffeine - everyday 3 cups in am (coffee, and icd tea) no sodas.  Education: GED, Working Materials engineer.     Social Determinants of Health   Financial Resource Strain: Not on file  Food Insecurity: Not on file  Transportation Needs: Not on file  Physical Activity: Not on file  Stress: Not on file  Social Connections: Not on file  Intimate Partner Violence: Not on file     No Known Allergies   Outpatient Medications Prior to Visit  Medication Sig Dispense Refill   albuterol (VENTOLIN HFA) 108 (90 Base) MCG/ACT inhaler Inhale 2 puffs into the lungs every 6 (six) hours as needed for wheezing or shortness of breath. 8 g 6   aspirin EC 81 MG tablet Take 1 tablet (81 mg total) by mouth daily. Swallow whole. 30 tablet 11   furosemide (LASIX) 20 MG tablet Take 20 mg by mouth daily as needed (swelling).     gabapentin (NEURONTIN) 300 MG capsule Take 300-600 mg by mouth at bedtime.     loratadine (CLARITIN) 10 MG tablet Take 1 tablet (10 mg total) by mouth daily. 30 tablet 11   omeprazole (PRILOSEC) 20 MG capsule TAKE 1 CAPSULE BY MOUTH EVERY DAY 90 capsule 0   sertraline (ZOLOFT) 25 MG tablet TAKE 1 TABLET (25 MG TOTAL) BY MOUTH DAILY. 90 tablet 0   simvastatin (ZOCOR) 40 MG tablet Take 1 tablet (40 mg total) by mouth at bedtime. 90 tablet 3   SUMAtriptan (IMITREX) 25 MG tablet TAKE 25 MG (1 TABLET) BY MOUTH AT THE START OF THE HEADACHE. MAY REPEAT IN 2 HOURS X 1 IF HEADACHE PERSISTS. MAX OF 2 TABS/24 HOURS. 9 tablet 2   vitamin B-12 (CYANOCOBALAMIN) 500 MCG tablet Take 1,000 mcg by mouth daily.     Vitamin D, Ergocalciferol, (DRISDOL) 1.25 MG (50000 UNIT) CAPS capsule TAKE 1 CAPSULE (50,000 UNITS TOTAL) BY MOUTH EVERY 7 (SEVEN) DAYS 12 capsule 4   hydrOXYzine (ATARAX/VISTARIL) 25 MG tablet TAKE 1 TABLET BY MOUTH 3 TIMES DAILY AS NEEDED FOR ITCHING. 270 tablet 3   Multiple Vitamins-Minerals (MULTIVITAMIN WITH MINERALS) tablet  Take 1 tablet by mouth daily.     POTASSIUM PO Take 1 tablet by mouth daily as needed (when you take lasix).     pyridoxine (B-6) 100 MG tablet Take 100 mg by mouth daily.     No facility-administered medications prior to visit.  Objective:   Physical Exam Vitals:   07/26/21 1206  BP: 120/70  Pulse: 72  Temp: 98.3 F (36.8 C)  TempSrc: Oral  SpO2: 95%  Weight: 143 lb (64.9 kg)  Height: '5\' 6"'$  (1.676 m)   Gen: Pleasant, well-nourished, in no distress,  normal affect  ENT: No lesions,  mouth clear,  oropharynx clear, no postnasal drip  Neck: No JVD, no stridor  Lungs: No use of accessory muscles, no crackles or wheezing on normal respiration, she does have some cough but no wheeze on forced expiration  Cardiovascular: RRR, heart sounds normal, no murmur or gallops, no peripheral edema  Musculoskeletal: No deformities, no cyanosis or clubbing  Neuro: alert, awake, non focal  Skin: Warm, no lesions or rash      Assessment & Plan:  COPD (chronic obstructive pulmonary disease) (HCC) Mild COPD based on spirometry today.  Her principal symptom is cough and unclear to me whether this reflects bronchospasm or lower airways disease.  She has albuterol which has not helped the cough.  I do not think we need to start a scheduled medication right now although could consider ICS/LABA in the future given the chronic bronchitic phenotype.  We will follow  Chronic cough Persistent and very bothersome.  She has not gotten much impact from better control of her GERD (although her reflux is better).  She remains on omeprazole once daily.  She does feel that she has congestion and drainage.  She tried the loratadine and it dried her out.  I think it would be reasonable to try Xyzal, fluticasone nasal spray to see if we control rhinitis more effectively.  Certainly contributor is her tobacco use.  We talked about this today and she is frustrated because she has not been able to stop,  really does not want to stop.  We will have to work on it further going forward.  Her imaging is reassuring.  If she continues to have cough then she probably needs an airway inspection by bronchoscopy.  Briefly touched on this today  Tobacco use Continues to smoke.  She continues to get pleasure from this and really is not motivated to stop.  We will have to continue to speak with her going forward, see if we can get to a point where stopping can be a goal.  For now I tried to encourage her to cut down.  We can set some future goals to decrease as we go forward.  Time spent 40 minutes  Baltazar Apo, MD, PhD 07/26/2021, 12:55 PM [facility] [phone] or if no answer before 7:00PM call [phone] For any issues after 7:00PM please call eLink [phone]

## 2021-09-03 ENCOUNTER — Telehealth: Payer: Self-pay | Admitting: Family

## 2021-09-03 NOTE — Telephone Encounter (Signed)
Called pt 2xs to schedule appt w/ pcp. No answer, LVM to call back if still needed.
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
## 2021-09-25 NOTE — Telephone Encounter (Signed)
error 

## 2021-09-27 ENCOUNTER — Other Ambulatory Visit: Payer: Self-pay | Admitting: Family

## 2021-09-27 DIAGNOSIS — F419 Anxiety disorder, unspecified: Secondary | ICD-10-CM

## 2021-09-28 ENCOUNTER — Ambulatory Visit (INDEPENDENT_AMBULATORY_CARE_PROVIDER_SITE_OTHER): Payer: 59 | Admitting: Family

## 2021-09-28 ENCOUNTER — Encounter: Payer: Self-pay | Admitting: Family

## 2021-09-28 VITALS — BP 122/78 | HR 69 | Temp 98.3°F | Resp 18 | Ht 65.98 in | Wt 140.0 lb

## 2021-09-28 DIAGNOSIS — Z1231 Encounter for screening mammogram for malignant neoplasm of breast: Secondary | ICD-10-CM

## 2021-09-28 DIAGNOSIS — Z5320 Procedure and treatment not carried out because of patient's decision for unspecified reasons: Secondary | ICD-10-CM

## 2021-09-28 DIAGNOSIS — Z0001 Encounter for general adult medical examination with abnormal findings: Secondary | ICD-10-CM

## 2021-09-28 DIAGNOSIS — Z1329 Encounter for screening for other suspected endocrine disorder: Secondary | ICD-10-CM

## 2021-09-28 DIAGNOSIS — Z13228 Encounter for screening for other metabolic disorders: Secondary | ICD-10-CM

## 2021-09-28 DIAGNOSIS — Z131 Encounter for screening for diabetes mellitus: Secondary | ICD-10-CM

## 2021-09-28 DIAGNOSIS — D229 Melanocytic nevi, unspecified: Secondary | ICD-10-CM

## 2021-09-28 DIAGNOSIS — Z0000 Encounter for general adult medical examination without abnormal findings: Secondary | ICD-10-CM

## 2021-09-28 NOTE — Progress Notes (Signed)
Pt presents for annual physical exam, pt having issues with GERD frequently stating Omeprazole no longer working advised that she will contact her GI doctor and advise them.   Pt insurance will be changing on 07/31 so she will wait on any referrals for mammogram until she knows what insurance is being replaced for [facility]

## 2021-09-28 NOTE — Patient Instructions (Signed)
Preventive Care 40-64 Years Old, Female Preventive care refers to lifestyle choices and visits with your health care provider that can promote health and wellness. Preventive care visits are also called wellness exams. What can I expect for my preventive care visit? Counseling Your health care provider may ask you questions about your: Medical history, including: Past medical problems. Family medical history. Pregnancy history. Current health, including: Menstrual cycle. Method of birth control. Emotional well-being. Home life and relationship well-being. Sexual activity and sexual health. Lifestyle, including: Alcohol, nicotine or tobacco, and drug use. Access to firearms. Diet, exercise, and sleep habits. Work and work environment. Sunscreen use. Safety issues such as seatbelt and bike helmet use. Physical exam Your health care provider will check your: Height and weight. These may be used to calculate your BMI (body mass index). BMI is a measurement that tells if you are at a healthy weight. Waist circumference. This measures the distance around your waistline. This measurement also tells if you are at a healthy weight and may help predict your risk of certain diseases, such as type 2 diabetes and high blood pressure. Heart rate and blood pressure. Body temperature. Skin for abnormal spots. What immunizations do I need?  Vaccines are usually given at various ages, according to a schedule. Your health care provider will recommend vaccines for you based on your age, medical history, and lifestyle or other factors, such as travel or where you work. What tests do I need? Screening Your health care provider may recommend screening tests for certain conditions. This may include: Lipid and cholesterol levels. Diabetes screening. This is done by checking your blood sugar (glucose) after you have not eaten for a while (fasting). Pelvic exam and Pap test. Hepatitis B test. Hepatitis C  test. HIV (human immunodeficiency virus) test. STI (sexually transmitted infection) testing, if you are at risk. Lung cancer screening. Colorectal cancer screening. Mammogram. Talk with your health care provider about when you should start having regular mammograms. This may depend on whether you have a family history of breast cancer. BRCA-related cancer screening. This may be done if you have a family history of breast, ovarian, tubal, or peritoneal cancers. Bone density scan. This is done to screen for osteoporosis. Talk with your health care provider about your test results, treatment options, and if necessary, the need for more tests. Follow these instructions at home: Eating and drinking  Eat a diet that includes fresh fruits and vegetables, whole grains, lean protein, and low-fat dairy products. Take vitamin and mineral supplements as recommended by your health care provider. Do not drink alcohol if: Your health care provider tells you not to drink. You are pregnant, may be pregnant, or are planning to become pregnant. If you drink alcohol: Limit how much you have to 0-1 drink a day. Know how much alcohol is in your drink. In the [place] one drink equals one 12 oz bottle of beer (355 mL), one 5 oz glass of wine (148 mL), or one 1 oz glass of hard liquor (44 mL). Lifestyle Brush your teeth every morning and night with fluoride toothpaste. Floss one time each day. Exercise for at least 30 minutes 5 or more days each week. Do not use any products that contain nicotine or tobacco. These products include cigarettes, chewing tobacco, and vaping devices, such as e-cigarettes. If you need help quitting, ask your health care provider. Do not use drugs. If you are sexually active, practice safe sex. Use a condom or other form of protection to   prevent STIs. If you do not wish to become pregnant, use a form of birth control. If you plan to become pregnant, see your health care provider for a  prepregnancy visit. Take aspirin only as told by your health care provider. Make sure that you understand how much to take and what form to take. Work with your health care provider to find out whether it is safe and beneficial for you to take aspirin daily. Find healthy ways to manage stress, such as: Meditation, yoga, or listening to music. Journaling. Talking to a trusted person. Spending time with friends and family. Minimize exposure to UV radiation to reduce your risk of skin cancer. Safety Always wear your seat belt while driving or riding in a vehicle. Do not drive: If you have been drinking alcohol. Do not ride with someone who has been drinking. When you are tired or distracted. While texting. If you have been using any mind-altering substances or drugs. Wear a helmet and other protective equipment during sports activities. If you have firearms in your house, make sure you follow all gun safety procedures. Seek help if you have been physically or sexually abused. What's next? Visit your health care provider once a year for an annual wellness visit. Ask your health care provider how often you should have your eyes and teeth checked. Stay up to date on all vaccines. This information is not intended to replace advice given to you by your health care provider. Make sure you discuss any questions you have with your health care provider. Document Revised: 08/16/2020 Document Reviewed: 08/16/2020 Elsevier Patient Education  [place].

## 2021-09-29 LAB — BASIC METABOLIC PANEL
BUN/Creatinine Ratio: 16 (ref 12–28)
BUN: 10 mg/dL (ref 8–27)
CO2: 20 mmol/L (ref 20–29)
Calcium: 9.5 mg/dL (ref 8.7–10.3)
Chloride: 101 mmol/L (ref 96–106)
Creatinine, Ser: 0.61 mg/dL (ref 0.57–1.00)
Glucose: 95 mg/dL (ref 70–99)
Potassium: 4.2 mmol/L (ref 3.5–5.2)
Sodium: 138 mmol/L (ref 134–144)
eGFR: 100 mL/min/{1.73_m2} (ref >59)

## 2021-09-29 LAB — TSH: TSH: 1.07 u[IU]/mL (ref 0.450–4.500)

## 2021-09-29 LAB — HEMOGLOBIN A1C
Est. average glucose Bld gHb Est-mCnc: 111 mg/dL
Hgb A1c MFr Bld: 5.5 % (ref 4.8–5.6)

## 2021-10-12 ENCOUNTER — Ambulatory Visit (HOSPITAL_COMMUNITY)
Admission: RE | Admit: 2021-10-12 | Discharge: 2021-10-12 | Disposition: A | Discharge status: Home / Self Care | Payer: Commercial Managed Care - HMO | Source: Ambulatory Visit | Attending: Infectious Diseases | Admitting: Infectious Diseases

## 2021-10-12 DIAGNOSIS — K7469 Other cirrhosis of liver: Secondary | ICD-10-CM | POA: Diagnosis present

## 2021-10-12 DIAGNOSIS — B1920 Unspecified viral hepatitis C without hepatic coma: Secondary | ICD-10-CM

## 2021-10-15 ENCOUNTER — Other Ambulatory Visit: Payer: Self-pay | Admitting: Infectious Diseases

## 2021-10-15 DIAGNOSIS — K7469 Other cirrhosis of liver: Secondary | ICD-10-CM

## 2021-10-15 DIAGNOSIS — B1920 Unspecified viral hepatitis C without hepatic coma: Secondary | ICD-10-CM

## 2021-10-22 ENCOUNTER — Ambulatory Visit: Payer: Self-pay | Admitting: *Deleted

## 2021-10-22 NOTE — Telephone Encounter (Signed)
     Chief Complaint: Productive cough with green mucus, Fever 100.7. Asking for antibiotic to be called in. Declines OV. Symptoms: Above Frequency: 1 week ago Pertinent Negatives: Patient denies  Disposition: '[]'$ ED /'[]'$ Urgent Care (no appt availability in office) / '[]'$ Appointment(In office/virtual)/ '[]'$  [place] [facility]/ '[]'$ Home Care/ '[]'$ Refused Recommended Disposition /'[]'$ [place] [facility]/ '[x]'$  Follow-up with PCP Additional Notes: Please advise pt.  Answer Assessment - Initial Assessment Questions 1. ONSET: "When did the cough begin?"      1 week ago 2. SEVERITY: "How bad is the cough today?"      Severe 3. SPUTUM: "Describe the color of your sputum" (none, dry cough; clear, white, yellow, green)     Green 4. HEMOPTYSIS: "Are you coughing up any blood?" If so ask: "How much?" (flecks, streaks, tablespoons, etc.)     No 5. DIFFICULTY BREATHING: "Are you having difficulty breathing?" If Yes, ask: "How bad is it?" (e.g., mild, moderate, severe)    - MILD: No SOB at rest, mild SOB with walking, speaks normally in sentences, can lie down, no retractions, pulse < 100.    - MODERATE: SOB at rest, SOB with minimal exertion and prefers to sit, cannot lie down flat, speaks in phrases, mild retractions, audible wheezing, pulse 100-120.    - SEVERE: Very SOB at rest, speaks in single words, struggling to breathe, sitting hunched forward, retractions, pulse > 120      No 6. FEVER: "Do you have a fever?" If Yes, ask: "What is your temperature, how was it measured, and when did it start?"     100.7 7. CARDIAC HISTORY: "Do you have any history of heart disease?" (e.g., heart attack, congestive heart failure)      No 8. LUNG HISTORY: "Do you have any history of lung disease?"  (e.g., pulmonary embolus, asthma, emphysema)     Yes 9. PE RISK FACTORS: "Do you have a history of blood clots?" (or: recent major surgery, recent prolonged travel, bedridden)     No 10. OTHER SYMPTOMS: "Do you have any  other symptoms?" (e.g., runny nose, wheezing, chest pain)       No 11. PREGNANCY: "Is there any chance you are pregnant?" "When was your last menstrual period?"       no 12. TRAVEL: "Have you traveled out of the country in the last month?" (e.g., travel history, exposures)       No  Protocols used: Cough - Acute Productive-A-AH

## 2021-10-22 NOTE — Telephone Encounter (Signed)
Pt may want to contact pulmonologist due to diagnosis of COPD, would needs office visit completed by PCP before antibiotic will be sent in.

## 2021-10-22 NOTE — Telephone Encounter (Signed)
Pt has bronchitis and is running a fever 100.7 and asked if provider could send in an antibiotic for this / please advise

## 2021-10-23 ENCOUNTER — Telehealth: Payer: Self-pay | Admitting: Family

## 2021-10-23 ENCOUNTER — Telehealth: Payer: Self-pay | Admitting: Emergency Medicine

## 2021-10-23 NOTE — Telephone Encounter (Signed)
Copied from [place] [id number]. Topic: Appointment Scheduling - Scheduling Inquiry for Clinic >> Oct 23, 2021 10:41 AM Tiffany B wrote: Reason for CRM: Informed patient of 10/22/2021 NT message from [place]. Patient has a pulmonologist appointment on 10/24/2021

## 2021-10-23 NOTE — Telephone Encounter (Signed)
Spk with patient offered her same day appt, states she doesn't want to come out in this heat and is waiting for her PCP to contact her before she decides.   Nothing further

## 2021-11-20 ENCOUNTER — Ambulatory Visit: Payer: Commercial Managed Care - HMO | Attending: Internal Medicine | Admitting: Internal Medicine

## 2021-11-20 ENCOUNTER — Encounter: Payer: Self-pay | Admitting: Internal Medicine

## 2021-11-20 VITALS — BP 120/70 | HR 75 | Ht 66.0 in | Wt 140.0 lb

## 2021-11-20 DIAGNOSIS — I2584 Coronary atherosclerosis due to calcified coronary lesion: Secondary | ICD-10-CM

## 2021-11-20 DIAGNOSIS — K7469 Other cirrhosis of liver: Secondary | ICD-10-CM | POA: Diagnosis not present

## 2021-11-20 DIAGNOSIS — I700 Atherosclerosis of aorta: Secondary | ICD-10-CM | POA: Diagnosis not present

## 2021-11-20 DIAGNOSIS — I2510 Atherosclerotic heart disease of native coronary artery without angina pectoris: Secondary | ICD-10-CM | POA: Diagnosis not present
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
## 2021-11-20 NOTE — Patient Instructions (Addendum)
Medication Instructions:  Your physician recommends that you continue on your current medications as directed. Please refer to the Current Medication list given to you today.  *If you need a refill on your cardiac medications before your next appointment, please call your pharmacy*   Lab Work: ON Sept 26 at GI appointment: LFT, FLP (labs slips printed out given to pt)  If you have labs (blood work) drawn today and your tests are completely normal, you will receive your results only by: [place] (if you have MyChart) OR A paper copy in the mail If you have any lab test that is abnormal or we need to change your treatment, we will call you to review the results.   Testing/Procedures: NONE   Follow-Up: At [facility], you and your health needs are our priority.  As part of our continuing mission to provide you with exceptional heart care, we have created designated Provider Care Teams.  These Care Teams include your primary Cardiologist (physician) and Advanced Practice Providers (APPs -  Physician Assistants and Nurse Practitioners) who all work together to provide you with the care you need, when you need it.    Your next appointment:   1 year(s)  The format for your next appointment:   In Person  Provider:   Werner Lean, MD     Important Information About Sugar

## 2021-11-21 ENCOUNTER — Telehealth: Payer: Self-pay

## 2021-11-21 DIAGNOSIS — I2584 Coronary atherosclerosis due to calcified coronary lesion: Secondary | ICD-10-CM

## 2021-11-21 DIAGNOSIS — I700 Atherosclerosis of aorta: Secondary | ICD-10-CM

## 2021-11-21 DIAGNOSIS — K7460 Unspecified cirrhosis of liver: Secondary | ICD-10-CM

## 2021-11-21 NOTE — Telephone Encounter (Signed)
Patient reviewed MyChart message. Last read by Leanord Asal at  1:28 PM on 11/21/2021.

## 2021-11-21 NOTE — Telephone Encounter (Signed)
-----   Message from Levin Erp, [place] sent at 11/21/2021  8:37 AM EDT ----- Regarding: labs Can you check on status of her labs and get lipids and lfts? Thanks! JLL ----- Message ----- From: Werner Lean, MD Sent: 11/20/2021   2:29 PM EDT To: Levin Erp, PA  Afternoon, Would you and your lab be willing to draw her lipids and LFTs? I was hoping to get them fasting with her PCP but it did not end up working out.  I wanted to make sure that we could treat her from a NASH cirrhosis standpoint.  Thanks for considering, MAC

## 2021-11-21 NOTE — Telephone Encounter (Signed)
Lab orders in epic.   Left pt a detailed vm asking that she stop by our lab this week or early next week for lipid panel and LFT's. I informed patient that she should fast for labs. I provided patient with the lab hours. I asked that patient call back with any questions. I advised that I will send information via MyChart as well.

## 2021-11-22 ENCOUNTER — Other Ambulatory Visit (INDEPENDENT_AMBULATORY_CARE_PROVIDER_SITE_OTHER): Payer: Commercial Managed Care - HMO

## 2021-11-22 DIAGNOSIS — I2510 Atherosclerotic heart disease of native coronary artery without angina pectoris: Secondary | ICD-10-CM

## 2021-11-22 DIAGNOSIS — K7460 Unspecified cirrhosis of liver: Secondary | ICD-10-CM | POA: Diagnosis not present

## 2021-11-22 DIAGNOSIS — I700 Atherosclerosis of aorta: Secondary | ICD-10-CM | POA: Diagnosis not present

## 2021-11-22 DIAGNOSIS — I2584 Coronary atherosclerosis due to calcified coronary lesion: Secondary | ICD-10-CM

## 2021-11-22 LAB — HEPATIC FUNCTION PANEL
ALT: 33 U/L (ref 0–35)
AST: 35 U/L (ref 0–37)
Albumin: 3.9 g/dL (ref 3.5–5.2)
Alkaline Phosphatase: 92 U/L (ref 39–117)
Bilirubin, Direct: 0.2 mg/dL (ref 0.0–0.3)
Total Bilirubin: 0.5 mg/dL (ref 0.2–1.2)
Total Protein: 7.7 g/dL (ref 6.0–8.3)

## 2021-11-22 LAB — LIPID PANEL
Cholesterol: 121 mg/dL (ref 0–200)
HDL: 43.5 mg/dL (ref >39.00)
LDL Cholesterol: 58 mg/dL (ref 0–99)
NonHDL: 77.09
Total CHOL/HDL Ratio: 3
Triglycerides: 97 mg/dL (ref 0.0–149.0)
VLDL: 19.4 mg/dL (ref 0.0–40.0)

## 2021-11-27 ENCOUNTER — Ambulatory Visit: Payer: Commercial Managed Care - HMO | Admitting: Physician Assistant

## 2021-11-27 ENCOUNTER — Encounter: Payer: Self-pay | Admitting: Physician Assistant

## 2021-11-27 VITALS — BP 110/80 | HR 73 | Ht 66.0 in | Wt 139.5 lb

## 2021-11-27 DIAGNOSIS — K7469 Other cirrhosis of liver: Secondary | ICD-10-CM

## 2021-11-27 DIAGNOSIS — R053 Chronic cough: Secondary | ICD-10-CM

## 2021-11-27 DIAGNOSIS — K219 Gastro-esophageal reflux disease without esophagitis: Secondary | ICD-10-CM

## 2021-11-27 DIAGNOSIS — B1920 Unspecified viral hepatitis C without hepatic coma: Secondary | ICD-10-CM

## 2021-11-27 MED ORDER — PANTOPRAZOLE SODIUM 40 MG PO TBEC: 40.0000 mg | DELAYED_RELEASE_TABLET | Freq: Every day | ORAL | 3 refills | Status: DC
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
## 2021-11-27 NOTE — Patient Instructions (Addendum)
_______________________________________________________  If you are age 64 or older, your body mass index should be between 23-30. Your Body mass index is 22.52 kg/m. If this is out of the aforementioned range listed, please consider follow up with your Primary Care Provider.  If you are age 50 or younger, your body mass index should be between 19-25. Your Body mass index is 22.52 kg/m. If this is out of the aformentioned range listed, please consider follow up with your Primary Care Provider.   ________________________________________________________  The [place] [facility] providers would like to encourage you to use [facility] to communicate with providers for non-urgent requests or questions.  Due to long hold times on the telephone, sending your provider a message by [facility] may be a faster and more efficient way to get a response.  Please allow 48 business hours for a response.  Please remember that this is for non-urgent requests.  _______________________________________________________  Stop omeprazole   Start Pantoprazole 40 mg twice a day.   It was a pleasure to see you today!  Thank you for trusting me with your gastrointestinal care!

## 2021-12-03 ENCOUNTER — Other Ambulatory Visit: Payer: Self-pay | Admitting: Infectious Diseases

## 2021-12-03 DIAGNOSIS — L299 Pruritus, unspecified: Secondary | ICD-10-CM

## 2021-12-03 NOTE — Telephone Encounter (Signed)
Please advise on refill.

## 2021-12-03 NOTE — Telephone Encounter (Signed)
One more refill has been given - further fills to PCP so she can be monitored on this and have up to date drug management.  Thank you

## 2021-12-10 ENCOUNTER — Ambulatory Visit: Payer: 59 | Admitting: Gastroenterology

## 2021-12-12 ENCOUNTER — Telehealth: Payer: Self-pay | Admitting: Family

## 2021-12-12 NOTE — Telephone Encounter (Signed)
PT has been having excess mucus build up for almost a year. She states that it is progressively gotten worse and she waking up during the night gagging.  Bad cough and low grade fever. She states that it is very green. She wants Amy to call her in an antibiotic.I offered an appt but states dr knows all about it. Last visit/CPE 7/28 She states was given a referral  by Amy but they do not accept her insurance so she could not go. She states she has been to all the specialist and none can help, she wants to treat the infection. Asking for meds to be called in. FU at [phone]

## 2021-12-15 ENCOUNTER — Other Ambulatory Visit: Payer: Self-pay | Admitting: Family

## 2021-12-15 DIAGNOSIS — J329 Chronic sinusitis, unspecified: Secondary | ICD-10-CM

## 2021-12-15 MED ORDER — AMOXICILLIN-POT CLAVULANATE 875-125 MG PO TABS: 1.0000 | ORAL_TABLET | Freq: Two times a day (BID) | ORAL | 0 refills | Status: AC

## 2021-12-15 NOTE — Telephone Encounter (Signed)
Call patient with update. Amoxicillin-Clavulanate ordered. Seems patient established with [facility], last appointment 07/26/2021. On 10/23/2021 patient sent message to the same concerning upper respiratory symptoms and offered same day appointment which she declined. Regarding patient health insurance confirm with patient if she has health insurance. If she does have health insurance note here so that a new order to Pulmonology can be entered requesting an office that accepts her health insurance. If she does not then offer [facility] [employer]/orange card application.

## 2021-12-17 NOTE — Telephone Encounter (Signed)
Spoke w/pt which states that she is still established w/[facility], pt still did not state why she declined appt w/them back in August for the same issue provider sent in antibiotic for. Adv pt going forward she will have to f/u w/Pulmonologist for her issues w/cough and mucous

## 2021-12-24 ENCOUNTER — Other Ambulatory Visit: Payer: Self-pay | Admitting: Family

## 2021-12-24 DIAGNOSIS — F419 Anxiety disorder, unspecified: Secondary | ICD-10-CM

## 2021-12-29 ENCOUNTER — Other Ambulatory Visit: Payer: Self-pay | Admitting: Emergency Medicine

## 2022-01-10 ENCOUNTER — Other Ambulatory Visit: Payer: Self-pay | Admitting: Emergency Medicine

## 2022-02-01 DIAGNOSIS — Z419 Encounter for procedure for purposes other than remedying health state, unspecified: Secondary | ICD-10-CM | POA: Diagnosis not present

## 2022-02-20 ENCOUNTER — Telehealth: Payer: Commercial Managed Care - HMO | Admitting: Family Medicine

## 2022-02-20 DIAGNOSIS — R3989 Other symptoms and signs involving the genitourinary system: Secondary | ICD-10-CM | POA: Diagnosis not present

## 2022-02-20 MED ORDER — CEPHALEXIN 500 MG PO CAPS: 500.0000 mg | ORAL_CAPSULE | Freq: Two times a day (BID) | ORAL | 0 refills | Status: AC

## 2022-02-20 NOTE — Progress Notes (Signed)

## 2022-02-23 ENCOUNTER — Other Ambulatory Visit: Payer: Self-pay | Admitting: Infectious Diseases

## 2022-02-23 DIAGNOSIS — L299 Pruritus, unspecified: Secondary | ICD-10-CM

## 2022-02-27 NOTE — Telephone Encounter (Signed)
Order complete. 

## 2022-02-28 ENCOUNTER — Inpatient Hospital Stay (HOSPITAL_BASED_OUTPATIENT_CLINIC_OR_DEPARTMENT_OTHER): Payer: Commercial Managed Care - HMO | Admitting: Hematology and Oncology

## 2022-02-28 ENCOUNTER — Other Ambulatory Visit: Payer: Self-pay | Admitting: Hematology and Oncology

## 2022-02-28 ENCOUNTER — Inpatient Hospital Stay: Payer: Commercial Managed Care - HMO | Attending: Hematology and Oncology

## 2022-02-28 VITALS — BP 113/73 | HR 65 | Temp 97.8°F | Resp 18 | Wt 135.4 lb

## 2022-02-28 DIAGNOSIS — D472 Monoclonal gammopathy: Secondary | ICD-10-CM

## 2022-02-28 DIAGNOSIS — F17210 Nicotine dependence, cigarettes, uncomplicated: Secondary | ICD-10-CM | POA: Insufficient documentation

## 2022-02-28 DIAGNOSIS — Z79899 Other long term (current) drug therapy: Secondary | ICD-10-CM | POA: Diagnosis not present

## 2022-02-28 DIAGNOSIS — Z808 Family history of malignant neoplasm of other organs or systems: Secondary | ICD-10-CM | POA: Diagnosis not present

## 2022-02-28 DIAGNOSIS — D751 Secondary polycythemia: Secondary | ICD-10-CM | POA: Diagnosis not present

## 2022-02-28 DIAGNOSIS — F419 Anxiety disorder, unspecified: Secondary | ICD-10-CM | POA: Insufficient documentation

## 2022-02-28 DIAGNOSIS — Z83719 Family history of colon polyps, unspecified: Secondary | ICD-10-CM | POA: Insufficient documentation

## 2022-02-28 DIAGNOSIS — Z8051 Family history of malignant neoplasm of kidney: Secondary | ICD-10-CM | POA: Insufficient documentation

## 2022-02-28 DIAGNOSIS — K7460 Unspecified cirrhosis of liver: Secondary | ICD-10-CM | POA: Diagnosis not present

## 2022-02-28 DIAGNOSIS — K219 Gastro-esophageal reflux disease without esophagitis: Secondary | ICD-10-CM | POA: Insufficient documentation

## 2022-02-28 DIAGNOSIS — D696 Thrombocytopenia, unspecified: Secondary | ICD-10-CM | POA: Insufficient documentation

## 2022-02-28 DIAGNOSIS — B182 Chronic viral hepatitis C: Secondary | ICD-10-CM | POA: Diagnosis not present

## 2022-02-28 DIAGNOSIS — Z820 Family history of epilepsy and other diseases of the nervous system: Secondary | ICD-10-CM | POA: Diagnosis not present

## 2022-02-28 LAB — CBC WITH DIFFERENTIAL/PLATELET
Abs Immature Granulocytes: 0 10*3/uL (ref 0.00–0.07)
Basophils Absolute: 0 10*3/uL (ref 0.0–0.1)
Basophils Relative: 0 %
Eosinophils Absolute: 0.1 10*3/uL (ref 0.0–0.5)
Eosinophils Relative: 3 %
HCT: 45.8 % (ref 36.0–46.0)
Hemoglobin: 15.5 g/dL — ABNORMAL HIGH (ref 12.0–15.0)
Immature Granulocytes: 0 %
Lymphocytes Relative: 56 %
Lymphs Abs: 2.7 10*3/uL (ref 0.7–4.0)
MCH: 30.7 pg (ref 26.0–34.0)
MCHC: 33.8 g/dL (ref 30.0–36.0)
MCV: 90.7 fL (ref 80.0–100.0)
Monocytes Absolute: 0.3 10*3/uL (ref 0.1–1.0)
Monocytes Relative: 7 %
Neutro Abs: 1.6 10*3/uL — ABNORMAL LOW (ref 1.7–7.7)
Neutrophils Relative %: 34 %
Platelets: 103 10*3/uL — ABNORMAL LOW (ref 150–400)
RBC: 5.05 MIL/uL (ref 3.87–5.11)
RDW: 12.8 % (ref 11.5–15.5)
WBC: 4.8 10*3/uL (ref 4.0–10.5)
nRBC: 0 % (ref 0.0–0.2)

## 2022-02-28 LAB — CMP (CANCER CENTER ONLY)
ALT: 58 U/L — ABNORMAL HIGH (ref 0–44)
AST: 56 U/L — ABNORMAL HIGH (ref 15–41)
Albumin: 4.2 g/dL (ref 3.5–5.0)
Alkaline Phosphatase: 109 U/L (ref 38–126)
Anion gap: 6 (ref 5–15)
BUN: 16 mg/dL (ref 8–23)
CO2: 27 mmol/L (ref 22–32)
Calcium: 9.6 mg/dL (ref 8.9–10.3)
Chloride: 107 mmol/L (ref 98–111)
Creatinine: 0.63 mg/dL (ref 0.44–1.00)
GFR, Estimated: >60 mL/min (ref >60)
Glucose, Bld: 107 mg/dL — ABNORMAL HIGH (ref 70–99)
Potassium: 4.4 mmol/L (ref 3.5–5.1)
Sodium: 140 mmol/L (ref 135–145)
Total Bilirubin: 0.6 mg/dL (ref 0.3–1.2)
Total Protein: 8.1 g/dL (ref 6.5–8.1)

## 2022-02-28 LAB — LACTATE DEHYDROGENASE: LDH: 176 U/L (ref 98–192)
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
## 2022-03-01 LAB — KAPPA/LAMBDA LIGHT CHAINS
Kappa free light chain: 87.6 mg/L — ABNORMAL HIGH (ref 3.3–19.4)
Kappa, lambda light chain ratio: 2.95 — ABNORMAL HIGH (ref 0.26–1.65)
Lambda free light chains: 29.7 mg/L — ABNORMAL HIGH (ref 5.7–26.3)

## 2022-03-01 LAB — BETA 2 MICROGLOBULIN, SERUM: Beta-2 Microglobulin: 3.5 mg/L — ABNORMAL HIGH (ref 0.6–2.4)

## 2022-03-04 DIAGNOSIS — Z419 Encounter for procedure for purposes other than remedying health state, unspecified: Secondary | ICD-10-CM | POA: Diagnosis not present

## 2022-03-05 ENCOUNTER — Telehealth: Payer: Medicaid Other | Admitting: Nurse Practitioner

## 2022-03-05 DIAGNOSIS — J0140 Acute pansinusitis, unspecified: Secondary | ICD-10-CM

## 2022-03-05 MED ORDER — DOXYCYCLINE HYCLATE 100 MG PO TABS: 100.0000 mg | ORAL_TABLET | Freq: Two times a day (BID) | ORAL | 0 refills | Status: AC

## 2022-03-05 NOTE — Progress Notes (Signed)
E-Visit for Sinus Problems  We are sorry that you are not feeling well.  Here is how we plan to help!  Based on what you have shared with me it looks like you have sinusitis.  Sinusitis is inflammation and infection in the sinus cavities of the head.  Based on your presentation I believe you most likely have Acute Bacterial Sinusitis.  This is an infection caused by bacteria and is treated with antibiotics. I have prescribed Doxycycline '100mg'$  by mouth twice a day for 10 days. You may use an oral decongestant such as Mucinex D or if you have glaucoma or high blood pressure use plain Mucinex. Saline nasal spray help and can safely be used as often as needed for congestion.  If you develop worsening sinus pain, fever or notice severe headache and vision changes, or if symptoms are not better after completion of antibiotic, please schedule an appointment with a health care provider.    Be sure you are using your inhalers, including your rescue inhaler to help with the cough.   Sinus infections are not as easily transmitted as other respiratory infection, however we still recommend that you avoid close contact with loved ones, especially the very young and elderly.  Remember to wash your hands thoroughly throughout the day as this is the number one way to prevent the spread of infection!  Home Care: Only take medications as instructed by your medical team. Complete the entire course of an antibiotic. Do not take these medications with alcohol. A steam or ultrasonic humidifier can help congestion.  You can place a towel over your head and breathe in the steam from hot water coming from a faucet. Avoid close contacts especially the very young and the elderly. Cover your mouth when you cough or sneeze. Always remember to wash your hands.  Get Help Right Away If: You develop worsening fever or sinus pain. You develop a severe head ache or visual changes. Your symptoms persist after you have completed  your treatment plan.  Make sure you Understand these instructions. Will watch your condition. Will get help right away if you are not doing well or get worse.  Thank you for choosing an e-visit.  Your e-visit answers were reviewed by a board certified advanced clinical practitioner to complete your personal care plan. Depending upon the condition, your plan could have included both over the counter or prescription medications.  Please review your pharmacy choice. Make sure the pharmacy is open so you can pick up prescription now. If there is a problem, you may contact your provider through [employer] and have the prescription routed to another pharmacy.  Your safety is important to [place]. If you have drug allergies check your prescription carefully.   For the next 24 hours you can use MyChart to ask questions about today's visit, request a non-urgent call back, or ask for a work or school excuse. You will get an email in the next two days asking about your experience. I hope that your e-visit has been valuable and will speed your recovery.   Meds ordered this encounter  Medications   doxycycline (VIBRA-TABS) 100 MG tablet    Sig: Take 1 tablet (100 mg total) by mouth 2 (two) times daily for 10 days.    Dispense:  20 tablet    Refill:  0     I spent approximately 5 minutes reviewing the patient's history, current symptoms and coordinating their care today.

## 2022-03-06 ENCOUNTER — Inpatient Hospital Stay: Payer: Medicaid Other | Attending: Hematology and Oncology

## 2022-03-06 ENCOUNTER — Other Ambulatory Visit: Payer: Self-pay

## 2022-03-06 DIAGNOSIS — D472 Monoclonal gammopathy: Secondary | ICD-10-CM | POA: Insufficient documentation

## 2022-03-08 LAB — UPEP/UIFE/LIGHT CHAINS/TP, 24-HR UR
% BETA, Urine: 36.8 %
ALPHA 1 URINE: 1.1 %
Albumin, U: 25.2 %
Alpha 2, Urine: 17 %
Free Kappa Lt Chains,Ur: 14.84 mg/L (ref 1.17–86.46)
Free Kappa/Lambda Ratio: 11.24 (ref 1.83–14.26)
Free Lambda Lt Chains,Ur: 1.32 mg/L (ref 0.27–15.21)
GAMMA GLOBULIN URINE: 20 %
Total Protein, Urine-Ur/day: 75 mg/24 hr (ref 30–150)
Total Protein, Urine: 11.5 mg/dL
Total Volume: 650

## 2022-03-08 LAB — MULTIPLE MYELOMA PANEL, SERUM
Albumin SerPl Elph-Mcnc: 3.7 g/dL (ref 2.9–4.4)
Albumin/Glob SerPl: 1 (ref 0.7–1.7)
Alpha 1: 0.3 g/dL (ref 0.0–0.4)
Alpha2 Glob SerPl Elph-Mcnc: 0.7 g/dL (ref 0.4–1.0)
B-Globulin SerPl Elph-Mcnc: 1 g/dL (ref 0.7–1.3)
Gamma Glob SerPl Elph-Mcnc: 1.8 g/dL (ref 0.4–1.8)
Globulin, Total: 3.8 g/dL (ref 2.2–3.9)
IgA: 301 mg/dL (ref 87–352)
IgG (Immunoglobin G), Serum: 1798 mg/dL — ABNORMAL HIGH (ref 586–1602)
IgM (Immunoglobulin M), Srm: 121 mg/dL (ref 26–217)
Total Protein ELP: 7.5 g/dL (ref 6.0–8.5)

## 2022-03-11 ENCOUNTER — Telehealth: Payer: Self-pay | Admitting: *Deleted

## 2022-03-11 NOTE — Telephone Encounter (Signed)
-----   Message from Orson Slick, MD sent at 03/10/2022  8:27 PM EST ----- Please let Mrs. Sulak know her MGUS labs are stable. We will see her back in June 2024.  ----- Message ----- From: Buel Ream, Lab [facility] [place] Sent: 02/28/2022  11:09 AM EST To: Orson Slick, MD

## 2022-03-11 NOTE — Telephone Encounter (Signed)
TCT patient regarding recent lab results. Spoke with her and advised that her MGUS labs are stable. We will see her back in June 2024. Pt voiced understanding.

## 2022-03-21 ENCOUNTER — Other Ambulatory Visit: Payer: Self-pay | Admitting: Family

## 2022-03-21 ENCOUNTER — Inpatient Hospital Stay: Payer: Medicaid Other | Attending: Hematology and Oncology

## 2022-03-21 DIAGNOSIS — F32A Depression, unspecified: Secondary | ICD-10-CM

## 2022-03-21 NOTE — Telephone Encounter (Signed)
Requested Prescriptions  Pending Prescriptions Disp Refills   sertraline (ZOLOFT) 25 MG tablet [Pharmacy Med Name: SERTRALINE HCL 25 MG TABLET] 90 tablet 0    Sig: TAKE 1 TABLET BY MOUTH EVERY DAY     Psychiatry:  Antidepressants - SSRI - sertraline Failed - 03/21/2022  8:59 AM      Failed - AST in normal range and within 360 days    AST  Date Value Ref Range Status  02/28/2022 56 (H) 15 - 41 U/L Final         Failed - ALT in normal range and within 360 days    ALT  Date Value Ref Range Status  02/28/2022 58 (H) 0 - 44 U/L Final  05/12/2020 112 (H) 6 - 29 U/L Final         Passed - Completed PHQ-2 or PHQ-9 in the last 360 days      Passed - Valid encounter within last 6 months    Recent Outpatient Visits           5 months ago Annual physical exam   Primary Care at [facility], Amy J, NP   11 months ago Anxiety and depression   Primary Care at [facility], [place], NP   1 year ago Anxiety and depression   Primary Care at [facility], [place], NP   1 year ago Encounter for smoking cessation counseling   Primary Care at [facility], [place], NP   1 year ago Screening cholesterol level   Primary Care at [facility], Flonnie Hailstone, NP

## 2022-03-28 ENCOUNTER — Other Ambulatory Visit: Payer: Self-pay | Admitting: Family

## 2022-03-28 DIAGNOSIS — L299 Pruritus, unspecified: Secondary | ICD-10-CM

## 2022-04-04 DIAGNOSIS — Z419 Encounter for procedure for purposes other than remedying health state, unspecified: Secondary | ICD-10-CM | POA: Diagnosis not present

## 2022-04-10 ENCOUNTER — Other Ambulatory Visit: Payer: Self-pay

## 2022-04-10 ENCOUNTER — Telehealth: Payer: Self-pay | Admitting: Adult Health

## 2022-04-10 ENCOUNTER — Ambulatory Visit: Payer: Medicaid Other | Admitting: Adult Health

## 2022-04-10 ENCOUNTER — Encounter: Payer: Self-pay | Admitting: Adult Health

## 2022-04-10 VITALS — BP 135/83 | HR 71 | Ht 66.0 in | Wt 132.2 lb

## 2022-04-10 DIAGNOSIS — G63 Polyneuropathy in diseases classified elsewhere: Secondary | ICD-10-CM

## 2022-04-10 DIAGNOSIS — F4323 Adjustment disorder with mixed anxiety and depressed mood: Secondary | ICD-10-CM

## 2022-04-10 DIAGNOSIS — D472 Monoclonal gammopathy: Secondary | ICD-10-CM

## 2022-04-10 MED ORDER — ESCITALOPRAM OXALATE 10 MG PO TABS: 10.0000 mg | ORAL_TABLET | Freq: Every day | ORAL | 5 refills | Status: DC

## 2022-04-10 NOTE — Patient Instructions (Addendum)
Your Plan:  Restart Gabapentin 300 mg at bedtime Restart B12 supplement  Start Lexapro 10 mg at bedtime If your symptoms worsen or you develop new symptoms please let us know.    Thank you for coming to see [place] at [facility]. I hope we have been able to provide you high quality care today.  You may receive a patient satisfaction survey over the next few weeks. We would appreciate your feedback and comments so that we may continue to improve ourselves and the health of our patients.
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
## 2022-04-10 NOTE — Telephone Encounter (Signed)
Per Jinny Blossom, NP's note from today: "Start Lexapro 10 mg at bedtime. I reviewed potential side effects with the patient. Advised patient that if her depression worsens or she begins to have thoughts of harming herself she should go to the ED or discussed with her PCP."  I placed the order for lexapro '10mg'$  [facility] at [facility] to [facility].

## 2022-04-10 NOTE — Addendum Note (Signed)
Addended by: Lester [address] A on: 04/10/2022 01:40 PM   Modules accepted: Orders

## 2022-04-10 NOTE — Telephone Encounter (Signed)
Pt checking if Lexapro, new medication was prescribe today at office viist. Pharmacy does not have it yet. Send [facility]  [id number]

## 2022-04-11 NOTE — Telephone Encounter (Signed)
Schedule appointment?

## 2022-04-11 NOTE — Telephone Encounter (Signed)
Please see the MyChart message reply(ies) for my assessment and plan.    This patient gave consent for this Medical Advice Message and is aware that it may result in a bill to their insurance company, as well as the possibility of receiving a bill for a co-payment or deductible. They are an established patient, but are not seeking medical advice exclusively about a problem treated during an in person or video visit in the last seven days. I did not recommend an in person or video visit within seven days of my reply.    I spent a total of 8 minutes cumulative time within 7 days through MyChart messaging.  Kimm Sider, NP   

## 2022-04-12 ENCOUNTER — Ambulatory Visit (HOSPITAL_COMMUNITY)
Admission: RE | Admit: 2022-04-12 | Discharge: 2022-04-12 | Disposition: A | Discharge status: Home / Self Care | Payer: Medicaid Other | Source: Ambulatory Visit | Attending: Infectious Diseases | Admitting: Infectious Diseases

## 2022-04-12 DIAGNOSIS — K7469 Other cirrhosis of liver: Secondary | ICD-10-CM | POA: Insufficient documentation

## 2022-04-12 DIAGNOSIS — B1920 Unspecified viral hepatitis C without hepatic coma: Secondary | ICD-10-CM | POA: Insufficient documentation

## 2022-04-12 DIAGNOSIS — K7460 Unspecified cirrhosis of liver: Secondary | ICD-10-CM | POA: Diagnosis not present

## 2022-04-14 IMAGING — CT CT BIOPSY AND ASPIRATION BONE MARROW
1 of 2 series · 15 of 31 positions shown, 19 images · non-contrast
Comparison: none

INDICATION: 64-year-old with monoclonal gammopathy of unknown significance.

[Series 2: i-spiral 5.0 br40 · axial · 0.88mm/px · z∈[+959,+1134]mm · 15 of 56 slices shown, 19 images]
[im 3/56  mediastinal]
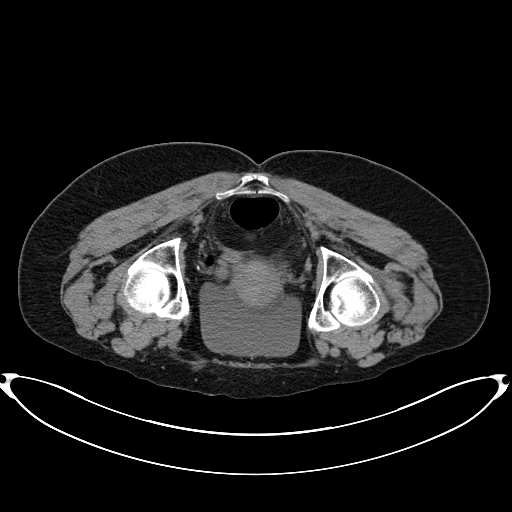
[im 3/56  lung]
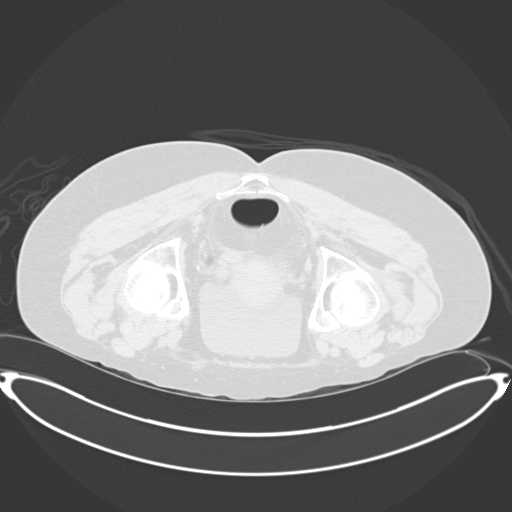
[im 7/56  lung]
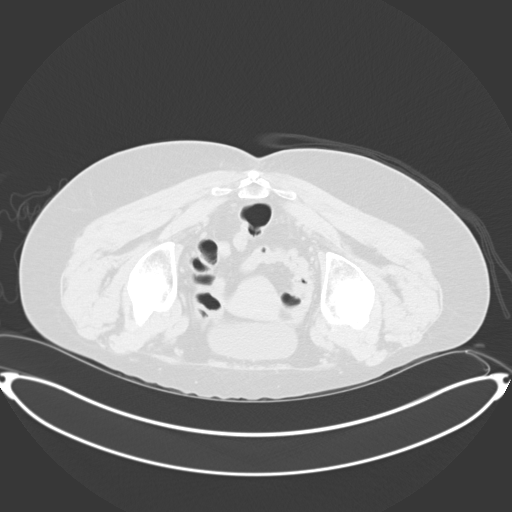
[im 11/56  lung]
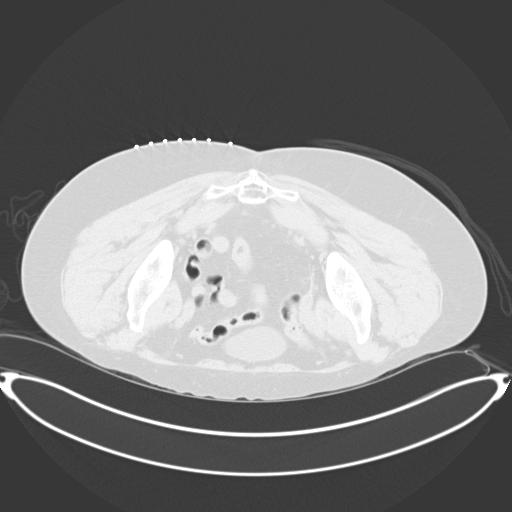
[im 14/56  lung]
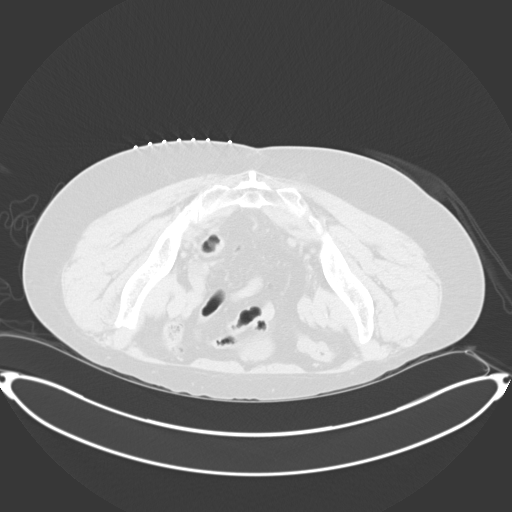
[im 17/56  mediastinal]
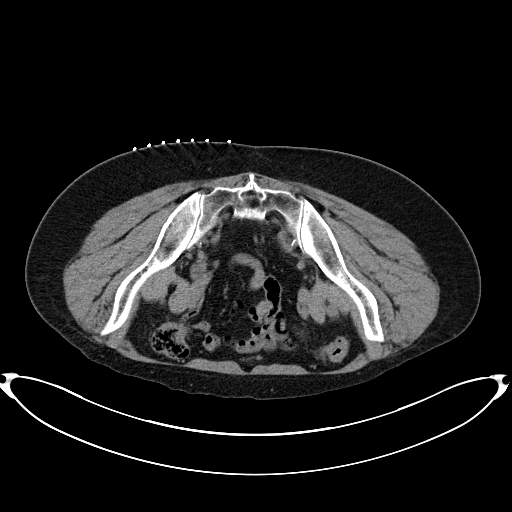
[im 17/56  lung]
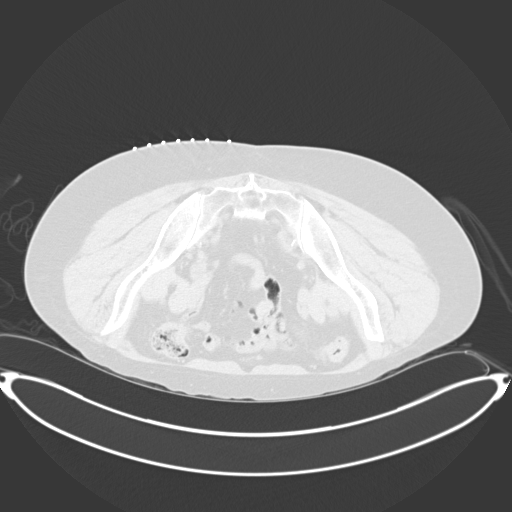
[im 20/56  lung]
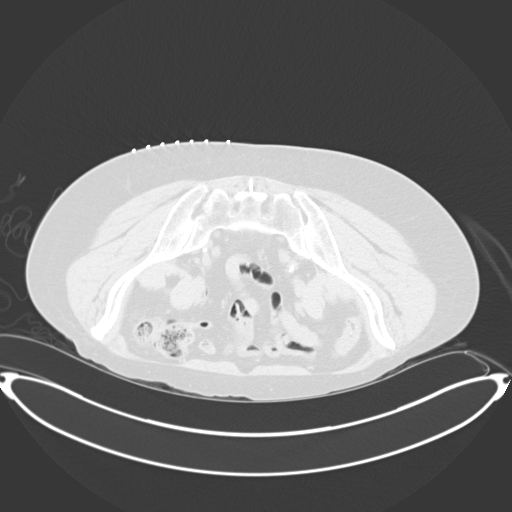
[im 24/56  lung]
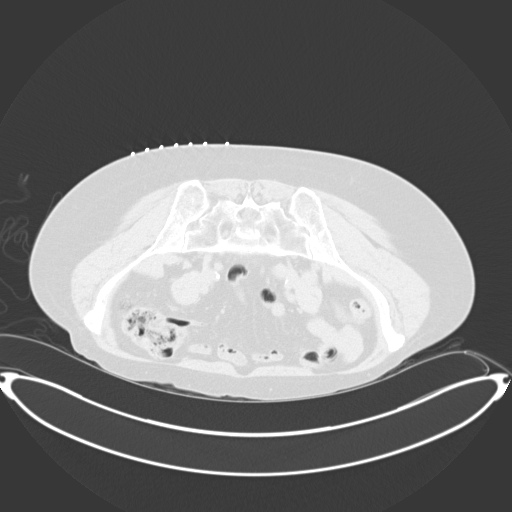
[im 28/56  lung]
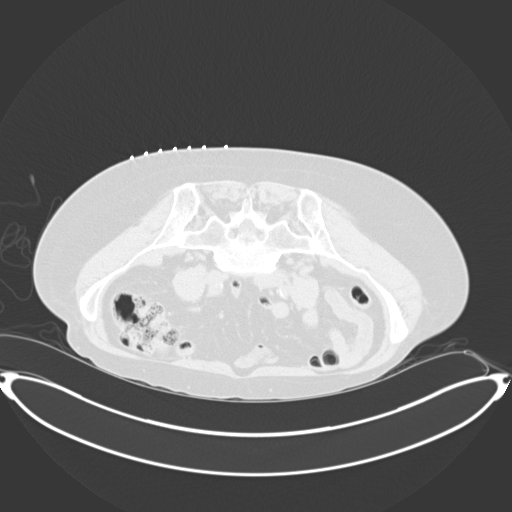
[im 32/56  mediastinal]
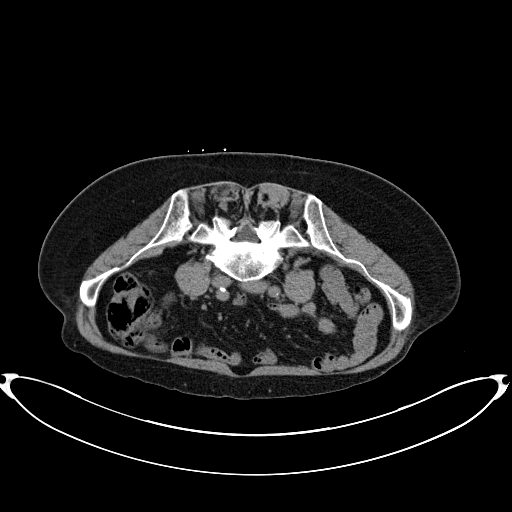
[im 32/56  lung]
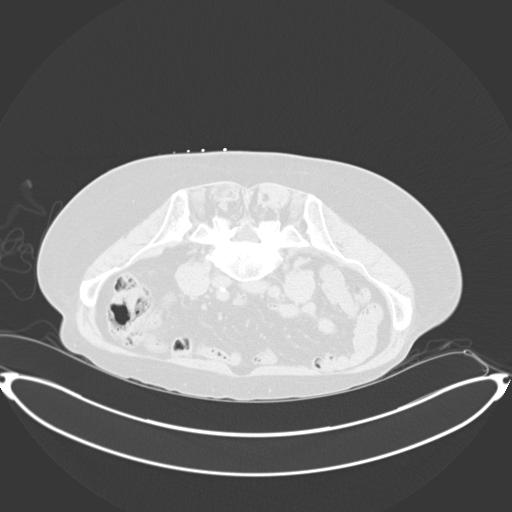
[im 36/56  lung]
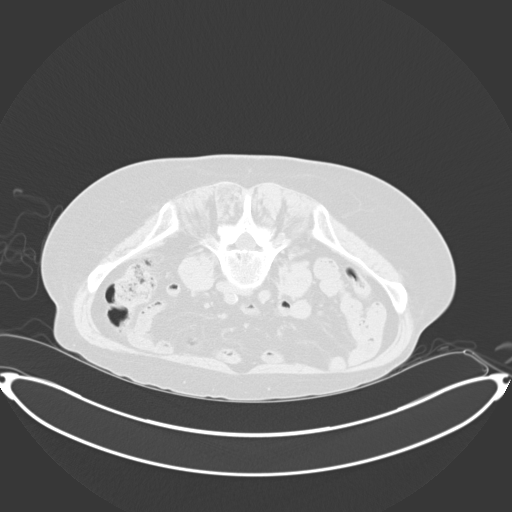
[im 39/56  lung]
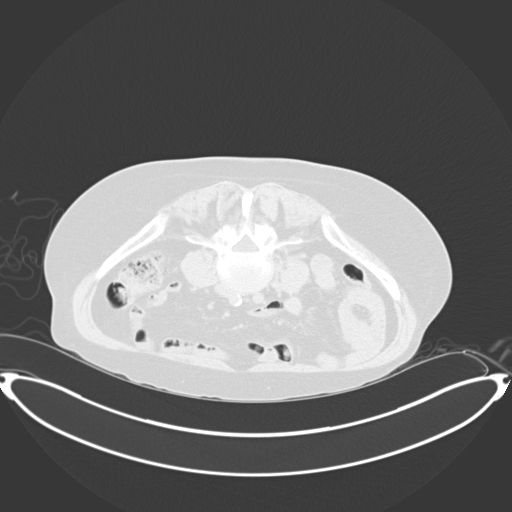
[im 42/56  lung]
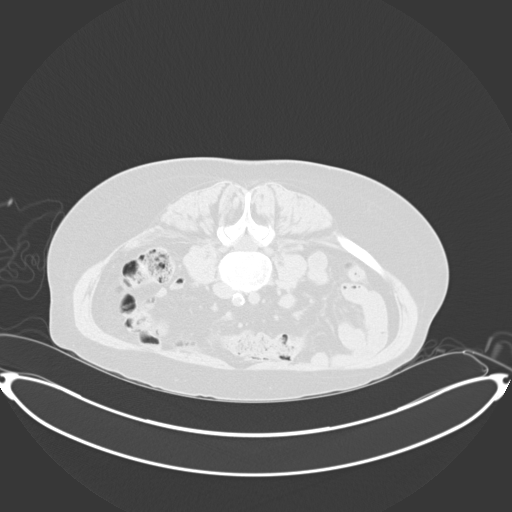
[im 45/56  mediastinal]
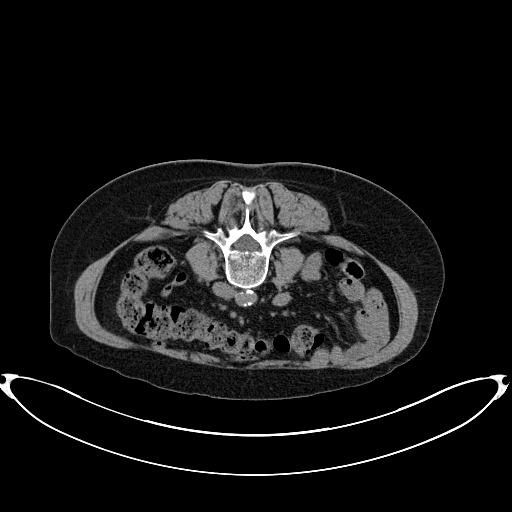
[im 45/56  lung]
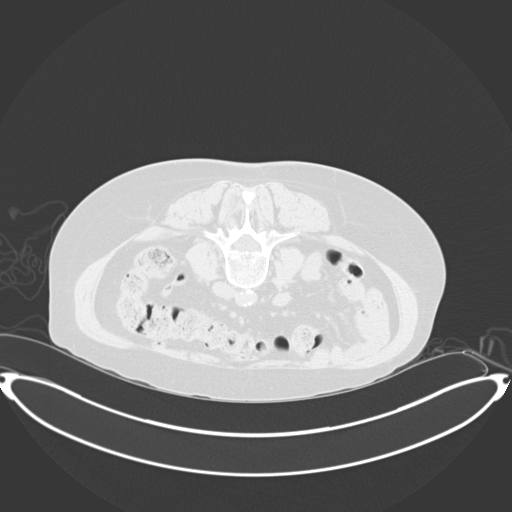
[im 49/56  lung]
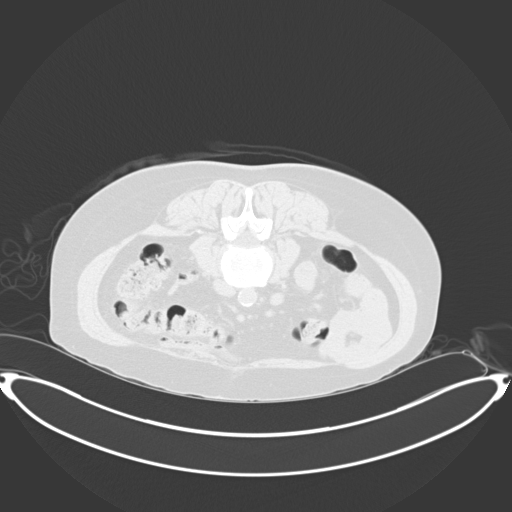
[im 53/56  lung]
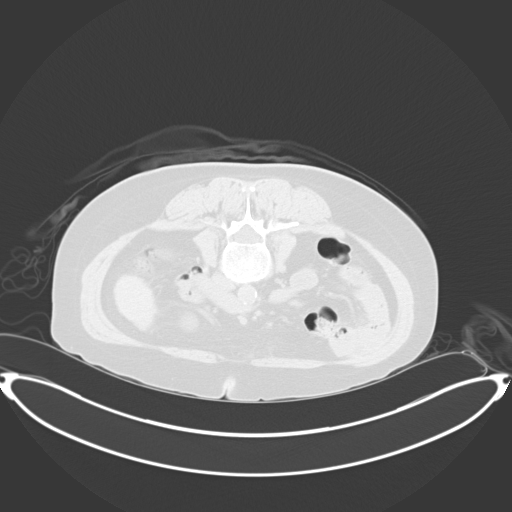

[15 of 31 positions shown; findings below may reference images not displayed]

EXAM:
CT GUIDED BONE MARROW ASPIRATES AND BIOPSY

MEDICATIONS:
None.

ANESTHESIA/SEDATION:
Fentanyl 100 mcg IV; Versed 4.0 mg IV

Moderate Sedation Time:  13 minutes

The patient was continuously monitored during the procedure by the
interventional radiology nurse under my direct supervision.

COMPLICATIONS:
None immediate.

PROCEDURE:
The procedure was explained to the patient. The risks and benefits
of the procedure were discussed and the patient's questions were
addressed. Informed consent was obtained from the patient. The
patient was placed prone on CT table. Images of the pelvis were
obtained. The right side of back was prepped and draped in sterile
fashion. The skin and right posterior ilium were anesthetized with
1% lidocaine. 11 gauge bone needle was directed into the right ilium
with CT guidance. Two aspirates and 3 core biopsies were obtained.
Bandage placed over the puncture site.
FINDINGS: Partially imaged right inguinal hernia containing fat. Liver has a
nodular contour and compatible with known cirrhosis. Atherosclerotic
calcifications in the abdominal aorta and iliac arteries.
IMPRESSION: CT guided bone marrow aspiration and core biopsy.

## 2022-04-14 IMAGING — CT CT BIOPSY
1 of 2 series · 15 of 31 positions shown, 19 images · non-contrast
Comparison: none

INDICATION: 64-year-old with monoclonal gammopathy of unknown significance.

[Series 2: i-spiral 5.0 br40 · axial · 0.88mm/px · z∈[+959,+1134]mm · 15 of 56 slices shown, 19 images]
[im 3/56  mediastinal]
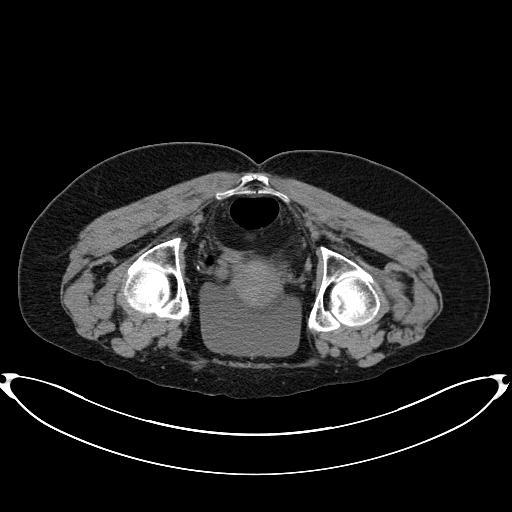
[im 3/56  lung]
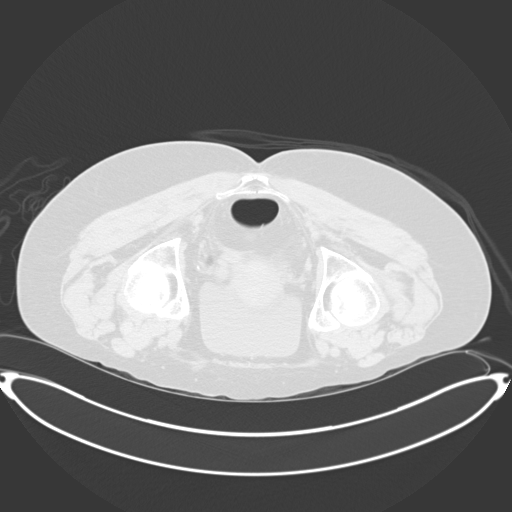
[im 7/56  lung]
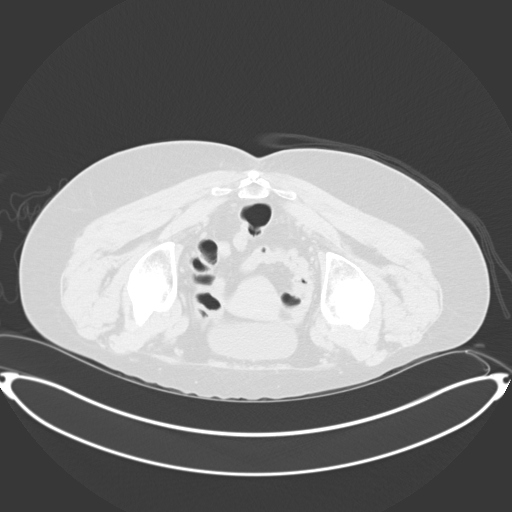
[im 11/56  lung]
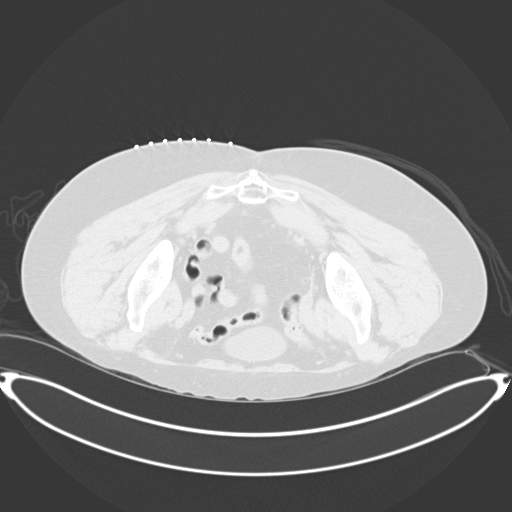
[im 14/56  lung]
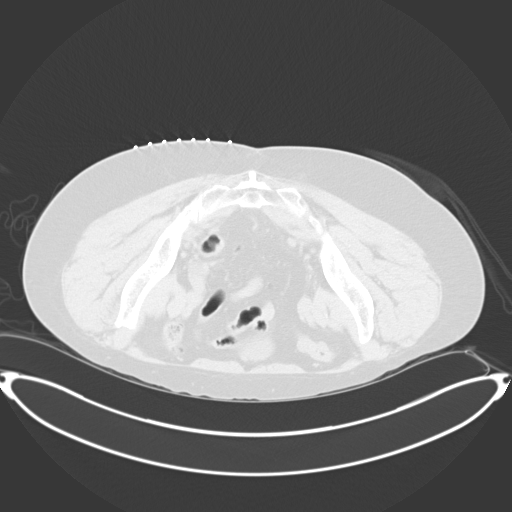
[im 17/56  mediastinal]
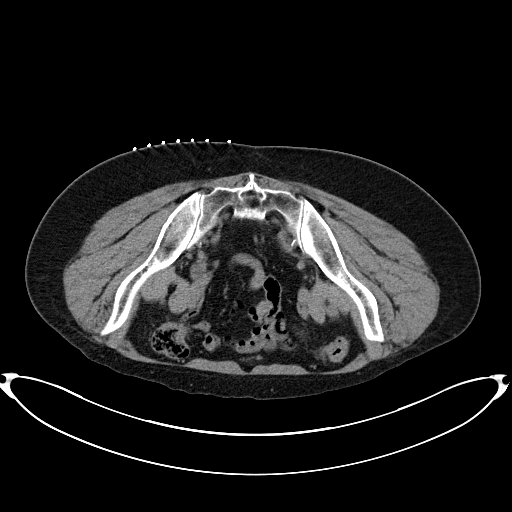
[im 17/56  lung]
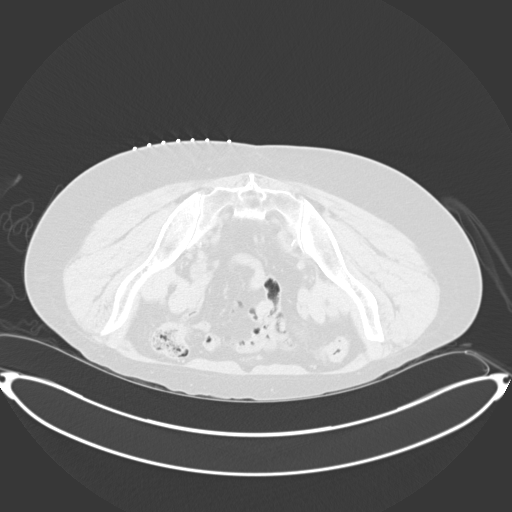
[im 20/56  lung]
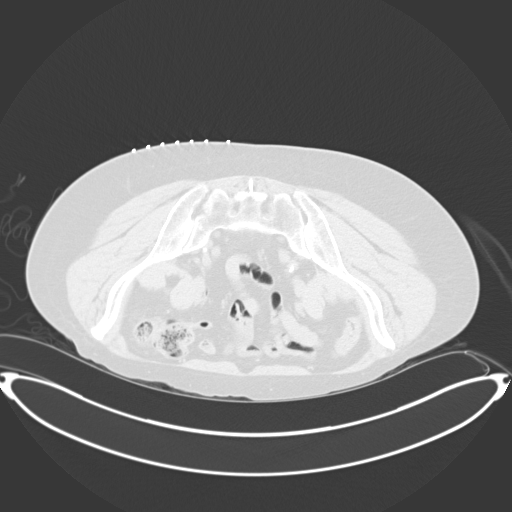
[im 24/56  lung]
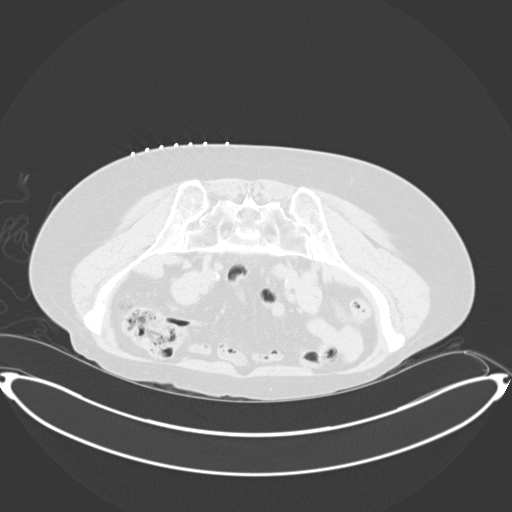
[im 28/56  lung]
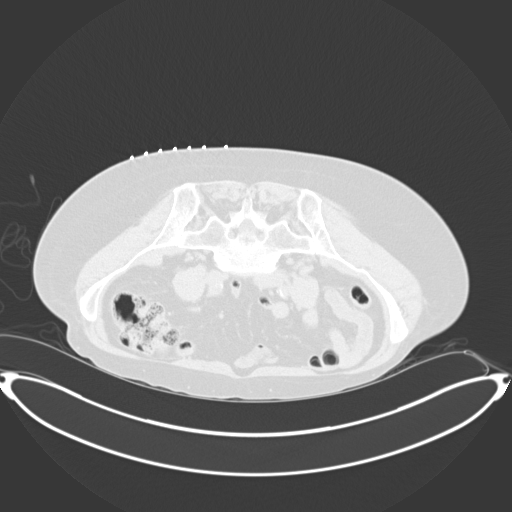
[im 32/56  mediastinal]
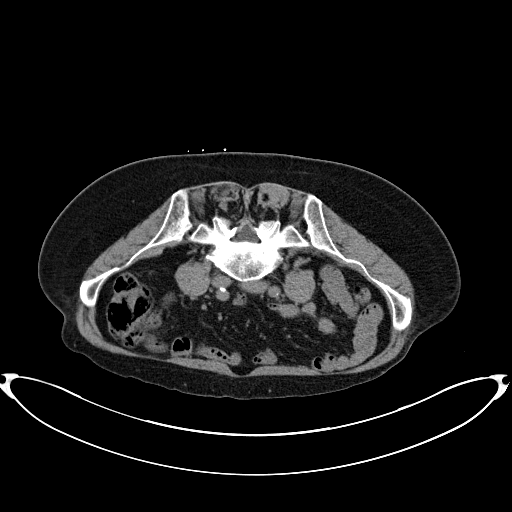
[im 32/56  lung]
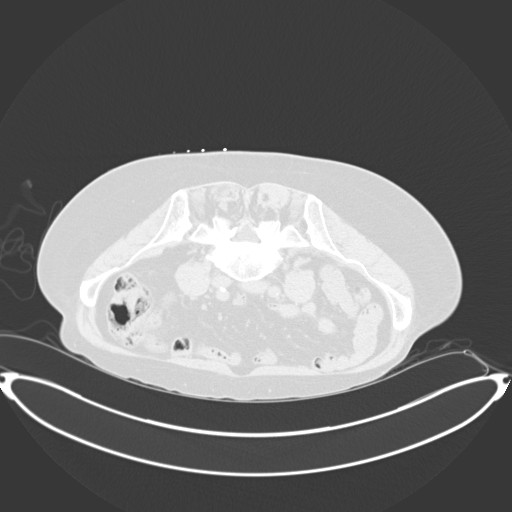
[im 36/56  lung]
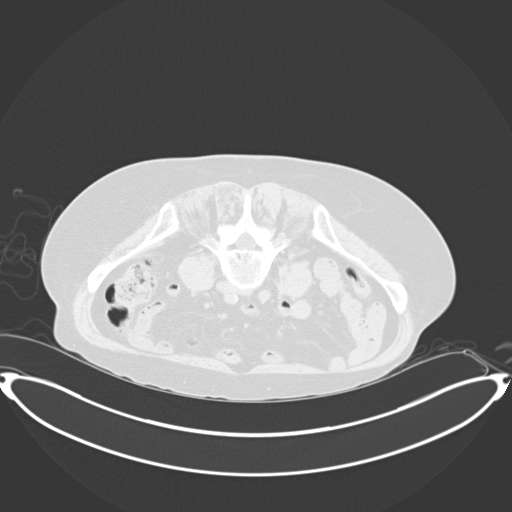
[im 39/56  lung]
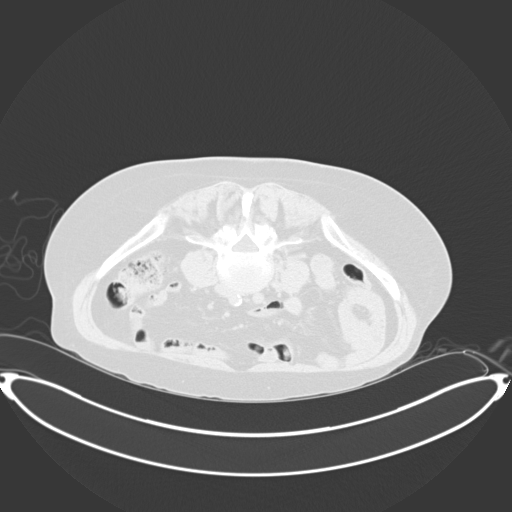
[im 42/56  lung]
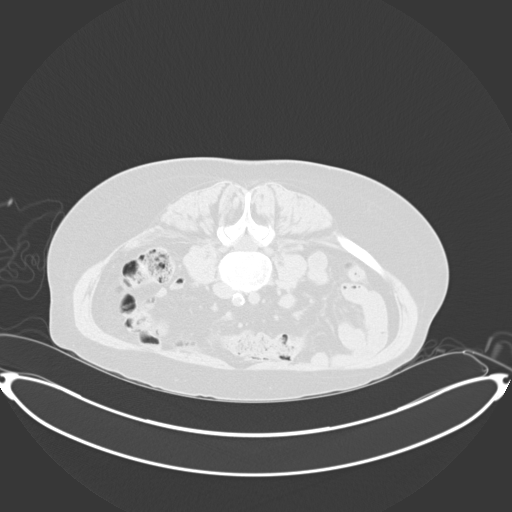
[im 45/56  mediastinal]
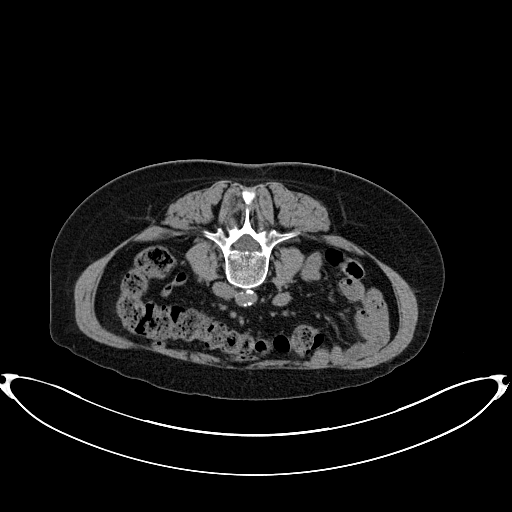
[im 45/56  lung]
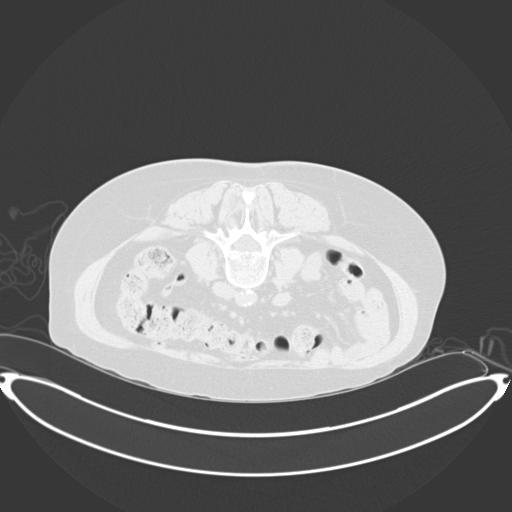
[im 49/56  lung]
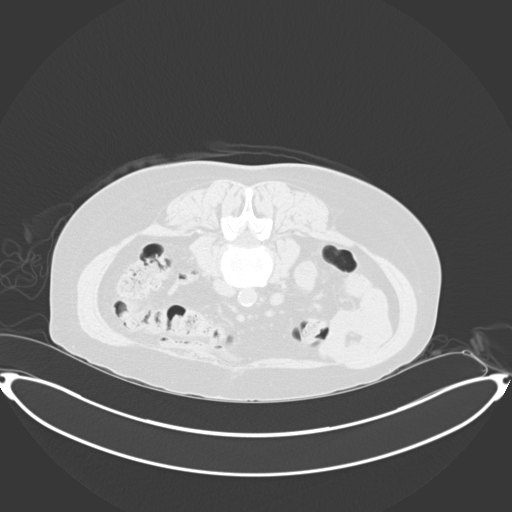
[im 53/56  lung]
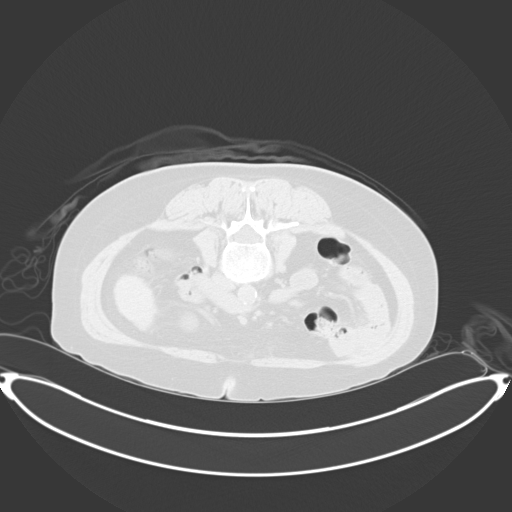

[15 of 31 positions shown; findings below may reference images not displayed]

EXAM:
CT GUIDED BONE MARROW ASPIRATES AND BIOPSY

MEDICATIONS:
None.

ANESTHESIA/SEDATION:
Fentanyl 100 mcg IV; Versed 4.0 mg IV

Moderate Sedation Time:  13 minutes

The patient was continuously monitored during the procedure by the
interventional radiology nurse under my direct supervision.

COMPLICATIONS:
None immediate.

PROCEDURE:
The procedure was explained to the patient. The risks and benefits
of the procedure were discussed and the patient's questions were
addressed. Informed consent was obtained from the patient. The
patient was placed prone on CT table. Images of the pelvis were
obtained. The right side of back was prepped and draped in sterile
fashion. The skin and right posterior ilium were anesthetized with
1% lidocaine. 11 gauge bone needle was directed into the right ilium
with CT guidance. Two aspirates and 3 core biopsies were obtained.
Bandage placed over the puncture site.
FINDINGS: Partially imaged right inguinal hernia containing fat. Liver has a
nodular contour and compatible with known cirrhosis. Atherosclerotic
calcifications in the abdominal aorta and iliac arteries.
IMPRESSION: CT guided bone marrow aspiration and core biopsy.

## 2022-05-01 ENCOUNTER — Other Ambulatory Visit: Payer: Self-pay | Admitting: Internal Medicine

## 2022-05-03 DIAGNOSIS — Z419 Encounter for procedure for purposes other than remedying health state, unspecified: Secondary | ICD-10-CM | POA: Diagnosis not present

## 2022-06-03 ENCOUNTER — Other Ambulatory Visit: Payer: Self-pay | Admitting: Adult Health

## 2022-06-03 DIAGNOSIS — E559 Vitamin D deficiency, unspecified: Secondary | ICD-10-CM

## 2022-06-03 DIAGNOSIS — Z419 Encounter for procedure for purposes other than remedying health state, unspecified: Secondary | ICD-10-CM | POA: Diagnosis not present
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
## 2022-06-12 ENCOUNTER — Ambulatory Visit (INDEPENDENT_AMBULATORY_CARE_PROVIDER_SITE_OTHER): Payer: Medicaid Other | Admitting: Family

## 2022-06-12 ENCOUNTER — Encounter: Payer: Self-pay | Admitting: Family

## 2022-06-12 VITALS — BP 135/81 | HR 71 | Temp 98.0°F | Resp 14 | Ht 66.0 in | Wt 135.2 lb

## 2022-06-12 DIAGNOSIS — F419 Anxiety disorder, unspecified: Secondary | ICD-10-CM

## 2022-06-12 DIAGNOSIS — F32A Depression, unspecified: Secondary | ICD-10-CM

## 2022-06-12 MED ORDER — FLUOXETINE HCL 10 MG PO CAPS: 10.0000 mg | ORAL_CAPSULE | Freq: Every day | ORAL | 0 refills | Status: DC

## 2022-06-12 NOTE — Progress Notes (Signed)
Pt here for anxiety f/u  States the Lexapro is not helping her anxiety   Pt states she went to the ENT but her insurance would not approve a CT scan, requesting if something can do done so she can have the CT scan done   Requesting hep C testing, history of same   Complaining of sinus congestion and productive cough for X1.5 years

## 2022-06-13 ENCOUNTER — Telehealth: Payer: Self-pay | Admitting: Family

## 2022-06-13 NOTE — Telephone Encounter (Signed)
Medication Refill - Medication: FLUoxetine (PROZAC) 10 MG capsule [[id number]]      patient states she has received a message that medicine requires an authorization   Has the patient contacted their pharmacy? Yes.    (Preferred Pharmacy (with phone number or street name):  [facility] [phone] [address] [address], [place] - [address]  [address] [place] [address]  Phone: [phone] Fax: [phone]  Hours: Not open 24 hours   Has the patient been seen for an appointment in the last year OR does the patient have an upcoming appointment? Yes.    Agent: Please be advised that RX refills may take up to 3 business days. We ask that you follow-up with your pharmacy.

## 2022-06-15 ENCOUNTER — Other Ambulatory Visit: Payer: Self-pay | Admitting: Family

## 2022-06-15 DIAGNOSIS — F32A Depression, unspecified: Secondary | ICD-10-CM

## 2022-06-17 NOTE — Telephone Encounter (Signed)
D/C 06/12/22. Requested Prescriptions  Refused Prescriptions Disp Refills   sertraline (ZOLOFT) 25 MG tablet [Pharmacy Med Name: SERTRALINE HCL 25 MG TABLET] 90 tablet 0    Sig: TAKE 1 TABLET BY MOUTH EVERY DAY     Psychiatry:  Antidepressants - SSRI - sertraline Failed - 06/15/2022 11:30 AM      Failed - AST in normal range and within 360 days    AST  Date Value Ref Range Status  02/28/2022 56 (H) 15 - 41 U/L Final         Failed - ALT in normal range and within 360 days    ALT  Date Value Ref Range Status  02/28/2022 58 (H) 0 - 44 U/L Final  05/12/2020 112 (H) 6 - 29 U/L Final         Passed - Completed PHQ-2 or PHQ-9 in the last 360 days      Passed - Valid encounter within last 6 months    Recent Outpatient Visits           5 days ago Anxiety and depression   [facility], [place], NP   8 months ago Annual physical exam   [facility], Amy J, NP   1 year ago Anxiety and depression   [facility], [place], NP   1 year ago Anxiety and depression   [facility] [facility], [place], NP   1 year ago Encounter for smoking cessation counseling   [facility], Salomon Fick, NP       Future Appointments             In 3 weeks Rema Fendt, NP [facility]

## 2022-06-18 ENCOUNTER — Other Ambulatory Visit: Payer: Self-pay

## 2022-06-18 ENCOUNTER — Telehealth: Payer: Self-pay

## 2022-06-18 DIAGNOSIS — F32A Depression, unspecified: Secondary | ICD-10-CM

## 2022-06-18 DIAGNOSIS — J329 Chronic sinusitis, unspecified: Secondary | ICD-10-CM | POA: Diagnosis not present

## 2022-06-18 NOTE — Telephone Encounter (Signed)
Fluoxetine HCL  capsules approved thru [facility] -Approval Timeframe: Start Date 06/18/2022 End Date 03/04/2023

## 2022-06-18 NOTE — Telephone Encounter (Signed)
PA completed thru CoverMyMeds Key#[id number]-pending authorization thru [facility]

## 2022-06-26 ENCOUNTER — Ambulatory Visit: Payer: 59 | Admitting: Hematology and Oncology

## 2022-06-26 ENCOUNTER — Other Ambulatory Visit: Payer: 59
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
## 2022-07-10 ENCOUNTER — Ambulatory Visit (INDEPENDENT_AMBULATORY_CARE_PROVIDER_SITE_OTHER): Payer: Medicaid Other | Admitting: Family

## 2022-07-10 ENCOUNTER — Encounter: Payer: Self-pay | Admitting: Family

## 2022-07-10 VITALS — BP 120/75 | HR 70 | Temp 98.6°F | Resp 14 | Ht 66.0 in | Wt 135.0 lb

## 2022-07-10 DIAGNOSIS — F32A Depression, unspecified: Secondary | ICD-10-CM

## 2022-07-10 DIAGNOSIS — F419 Anxiety disorder, unspecified: Secondary | ICD-10-CM

## 2022-07-10 MED ORDER — FLUOXETINE HCL 10 MG PO CAPS: 10.0000 mg | ORAL_CAPSULE | Freq: Every day | ORAL | 0 refills | Status: DC

## 2022-07-10 NOTE — Progress Notes (Signed)
Pt is here for follow up   States her anxiety and depression have been manageable since her last appt  Requesting lipid panel

## 2022-08-04 ENCOUNTER — Other Ambulatory Visit: Payer: Self-pay | Admitting: Physician Assistant

## 2022-08-23 ENCOUNTER — Inpatient Hospital Stay: Payer: Medicaid Other | Attending: Hematology and Oncology

## 2022-08-23 ENCOUNTER — Other Ambulatory Visit: Payer: Self-pay | Admitting: Hematology and Oncology

## 2022-08-23 DIAGNOSIS — Z8051 Family history of malignant neoplasm of kidney: Secondary | ICD-10-CM | POA: Insufficient documentation

## 2022-08-23 DIAGNOSIS — D72822 Plasmacytosis: Secondary | ICD-10-CM | POA: Diagnosis not present

## 2022-08-23 DIAGNOSIS — Z79899 Other long term (current) drug therapy: Secondary | ICD-10-CM | POA: Insufficient documentation

## 2022-08-23 DIAGNOSIS — Z808 Family history of malignant neoplasm of other organs or systems: Secondary | ICD-10-CM | POA: Insufficient documentation

## 2022-08-23 DIAGNOSIS — Z83719 Family history of colon polyps, unspecified: Secondary | ICD-10-CM | POA: Insufficient documentation

## 2022-08-23 DIAGNOSIS — D696 Thrombocytopenia, unspecified: Secondary | ICD-10-CM | POA: Diagnosis not present

## 2022-08-23 DIAGNOSIS — F17210 Nicotine dependence, cigarettes, uncomplicated: Secondary | ICD-10-CM | POA: Insufficient documentation

## 2022-08-23 DIAGNOSIS — Z9089 Acquired absence of other organs: Secondary | ICD-10-CM | POA: Insufficient documentation

## 2022-08-23 DIAGNOSIS — Z820 Family history of epilepsy and other diseases of the nervous system: Secondary | ICD-10-CM | POA: Diagnosis not present

## 2022-08-23 DIAGNOSIS — D472 Monoclonal gammopathy: Secondary | ICD-10-CM

## 2022-08-23 DIAGNOSIS — F419 Anxiety disorder, unspecified: Secondary | ICD-10-CM | POA: Insufficient documentation

## 2022-08-23 DIAGNOSIS — D751 Secondary polycythemia: Secondary | ICD-10-CM | POA: Insufficient documentation

## 2022-08-23 DIAGNOSIS — K7460 Unspecified cirrhosis of liver: Secondary | ICD-10-CM | POA: Insufficient documentation

## 2022-08-23 LAB — CBC WITH DIFFERENTIAL (CANCER CENTER ONLY)
Abs Immature Granulocytes: 0 10*3/uL (ref 0.00–0.07)
Basophils Absolute: 0 10*3/uL (ref 0.0–0.1)
Basophils Relative: 0 %
Eosinophils Absolute: 0.1 10*3/uL (ref 0.0–0.5)
Eosinophils Relative: 2 %
HCT: 44.5 % (ref 36.0–46.0)
Hemoglobin: 15.2 g/dL — ABNORMAL HIGH (ref 12.0–15.0)
Immature Granulocytes: 0 %
Lymphocytes Relative: 46 %
Lymphs Abs: 2.1 10*3/uL (ref 0.7–4.0)
MCH: 30.5 pg (ref 26.0–34.0)
MCHC: 34.2 g/dL (ref 30.0–36.0)
MCV: 89.4 fL (ref 80.0–100.0)
Monocytes Absolute: 0.3 10*3/uL (ref 0.1–1.0)
Monocytes Relative: 8 %
Neutro Abs: 2 10*3/uL (ref 1.7–7.7)
Neutrophils Relative %: 44 %
Platelet Count: 91 10*3/uL — ABNORMAL LOW (ref 150–400)
RBC: 4.98 MIL/uL (ref 3.87–5.11)
RDW: 13.2 % (ref 11.5–15.5)
WBC Count: 4.6 10*3/uL (ref 4.0–10.5)
nRBC: 0 % (ref 0.0–0.2)

## 2022-08-23 LAB — CMP (CANCER CENTER ONLY)
ALT: 35 U/L (ref 0–44)
AST: 43 U/L — ABNORMAL HIGH (ref 15–41)
Albumin: 4.1 g/dL (ref 3.5–5.0)
Alkaline Phosphatase: 110 U/L (ref 38–126)
Anion gap: 5 (ref 5–15)
BUN: 14 mg/dL (ref 8–23)
CO2: 29 mmol/L (ref 22–32)
Calcium: 9.7 mg/dL (ref 8.9–10.3)
Chloride: 105 mmol/L (ref 98–111)
Creatinine: 0.72 mg/dL (ref 0.44–1.00)
GFR, Estimated: >60 mL/min (ref >60)
Glucose, Bld: 102 mg/dL — ABNORMAL HIGH (ref 70–99)
Potassium: 4.2 mmol/L (ref 3.5–5.1)
Sodium: 139 mmol/L (ref 135–145)
Total Bilirubin: 0.7 mg/dL (ref 0.3–1.2)
Total Protein: 7.6 g/dL (ref 6.5–8.1)

## 2022-08-23 LAB — LACTATE DEHYDROGENASE: LDH: 209 U/L — ABNORMAL HIGH (ref 98–192)

## 2022-08-24 LAB — BETA 2 MICROGLOBULIN, SERUM: Beta-2 Microglobulin: 3.2 mg/L — ABNORMAL HIGH (ref 0.6–2.4)

## 2022-08-26 LAB — KAPPA/LAMBDA LIGHT CHAINS
Kappa free light chain: 58.5 mg/L — ABNORMAL HIGH (ref 3.3–19.4)
Kappa, lambda light chain ratio: 2.52 — ABNORMAL HIGH (ref 0.26–1.65)
Lambda free light chains: 23.2 mg/L (ref 5.7–26.3)

## 2022-08-28 LAB — MULTIPLE MYELOMA PANEL, SERUM
Albumin SerPl Elph-Mcnc: 3.8 g/dL (ref 2.9–4.4)
Albumin/Glob SerPl: 1.1 (ref 0.7–1.7)
Alpha 1: 0.3 g/dL (ref 0.0–0.4)
Alpha2 Glob SerPl Elph-Mcnc: 0.7 g/dL (ref 0.4–1.0)
B-Globulin SerPl Elph-Mcnc: 1 g/dL (ref 0.7–1.3)
Gamma Glob SerPl Elph-Mcnc: 1.7 g/dL (ref 0.4–1.8)
Globulin, Total: 3.6 g/dL (ref 2.2–3.9)
IgA: 278 mg/dL (ref 87–352)
IgG (Immunoglobin G), Serum: 1720 mg/dL — ABNORMAL HIGH (ref 586–1602)
IgM (Immunoglobulin M), Srm: 99 mg/dL (ref 26–217)
Total Protein ELP: 7.4 g/dL (ref 6.0–8.5)

## 2022-08-30 ENCOUNTER — Inpatient Hospital Stay (HOSPITAL_BASED_OUTPATIENT_CLINIC_OR_DEPARTMENT_OTHER): Payer: Medicaid Other | Admitting: Hematology and Oncology

## 2022-08-30 ENCOUNTER — Other Ambulatory Visit: Payer: Self-pay

## 2022-08-30 VITALS — BP 127/72 | HR 68 | Temp 97.7°F | Resp 15 | Wt 135.6 lb

## 2022-08-30 DIAGNOSIS — D472 Monoclonal gammopathy: Secondary | ICD-10-CM
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
## 2022-09-20 ENCOUNTER — Ambulatory Visit: Payer: Medicaid Other | Admitting: Podiatry

## 2022-09-26 ENCOUNTER — Ambulatory Visit: Payer: Medicaid Other | Admitting: Podiatry

## 2022-09-26 ENCOUNTER — Ambulatory Visit (INDEPENDENT_AMBULATORY_CARE_PROVIDER_SITE_OTHER): Payer: Medicaid Other | Admitting: Podiatry

## 2022-09-26 DIAGNOSIS — G6289 Other specified polyneuropathies: Secondary | ICD-10-CM | POA: Diagnosis not present

## 2022-09-26 MED ORDER — PREGABALIN 50 MG PO CAPS: 50.0000 mg | ORAL_CAPSULE | Freq: Three times a day (TID) | ORAL | 2 refills | Status: DC
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
## 2022-10-03 ENCOUNTER — Ambulatory Visit: Payer: Self-pay | Admitting: Podiatry

## 2022-10-03 ENCOUNTER — Ambulatory Visit: Payer: Medicaid Other | Admitting: Podiatry

## 2022-10-10 ENCOUNTER — Encounter: Payer: Self-pay | Admitting: Podiatry

## 2022-10-10 ENCOUNTER — Ambulatory Visit (INDEPENDENT_AMBULATORY_CARE_PROVIDER_SITE_OTHER): Payer: Medicaid Other | Admitting: Family

## 2022-10-10 ENCOUNTER — Encounter: Payer: Self-pay | Admitting: Family

## 2022-10-10 VITALS — BP 112/74 | HR 76 | Temp 98.7°F | Ht 66.25 in | Wt 133.4 lb

## 2022-10-10 DIAGNOSIS — Z131 Encounter for screening for diabetes mellitus: Secondary | ICD-10-CM

## 2022-10-10 DIAGNOSIS — I8393 Asymptomatic varicose veins of bilateral lower extremities: Secondary | ICD-10-CM

## 2022-10-10 DIAGNOSIS — F32A Depression, unspecified: Secondary | ICD-10-CM

## 2022-10-10 DIAGNOSIS — Z13228 Encounter for screening for other metabolic disorders: Secondary | ICD-10-CM

## 2022-10-10 DIAGNOSIS — Z1321 Encounter for screening for nutritional disorder: Secondary | ICD-10-CM

## 2022-10-10 DIAGNOSIS — F419 Anxiety disorder, unspecified: Secondary | ICD-10-CM

## 2022-10-10 MED ORDER — FLUOXETINE HCL 10 MG PO CAPS
10.0000 mg | ORAL_CAPSULE | Freq: Every day | ORAL | 0 refills | Status: DC
Start: 2022-10-10 — End: 2023-01-20
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
## 2022-10-10 NOTE — Progress Notes (Signed)
Pt states neuropathy burning in her feet.  Pt asking for standard blood work including vitamin labs and liver labs.  Pt states both her arms and hands start tingling.

## 2022-10-11 ENCOUNTER — Other Ambulatory Visit: Payer: Self-pay | Admitting: Family

## 2022-10-11 DIAGNOSIS — Z1321 Encounter for screening for nutritional disorder: Secondary | ICD-10-CM

## 2022-10-11 DIAGNOSIS — Z13228 Encounter for screening for other metabolic disorders: Secondary | ICD-10-CM

## 2022-10-16 ENCOUNTER — Ambulatory Visit: Payer: Medicaid Other | Admitting: Adult Health

## 2022-10-16 ENCOUNTER — Encounter: Payer: Self-pay | Admitting: Adult Health

## 2022-10-16 VITALS — BP 105/65 | HR 66 | Ht 66.0 in | Wt 134.4 lb

## 2022-10-16 DIAGNOSIS — G63 Polyneuropathy in diseases classified elsewhere: Secondary | ICD-10-CM

## 2022-10-16 DIAGNOSIS — D472 Monoclonal gammopathy: Secondary | ICD-10-CM

## 2022-10-16 MED ORDER — GABAPENTIN 400 MG PO CAPS: 400.0000 mg | ORAL_CAPSULE | Freq: Every day | ORAL | 11 refills | Status: AC
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
## 2022-10-16 NOTE — Progress Notes (Signed)
[employer] form completed and faxed. Confirmation received.

## 2022-10-16 NOTE — Patient Instructions (Signed)
Your Plan:  Increase gabapentin 400 mg at bedtime Will order nerve conduction studies.  If your symptoms worsen or you develop new symptoms please let us know.   Thank you for coming to see [place] at [facility]. I hope we have been able to provide you high quality care today.  You may receive a patient satisfaction survey over the next few weeks. We would appreciate your feedback and comments so that we may continue to improve ourselves and the health of our patients.

## 2022-10-23 ENCOUNTER — Encounter: Payer: Self-pay | Admitting: Adult Health

## 2022-10-24 LAB — HM MAMMOGRAPHY

## 2022-10-24 NOTE — Telephone Encounter (Signed)
[employer] order sheet ready for signature by MM NP.

## 2022-11-03 ENCOUNTER — Other Ambulatory Visit: Payer: Self-pay | Admitting: Internal Medicine

## 2022-11-03 HISTORY — PX: NASAL SINUS SURGERY: SHX719

## 2022-11-06 ENCOUNTER — Ambulatory Visit: Payer: Medicaid Other | Admitting: Adult Health

## 2022-11-14 NOTE — Progress Notes (Signed)
Cardiology Office Note:  .   Date:  11/15/2022  ID:  Laura Sloan, DOB 1955/12/17, MRN [id number] PCP: Rema Fendt, NP  [facility] Providers Cardiologist:  Christell Constant, MD    Patient Profile: .      PMH Remote history of substance abuse Hepatitis B Hepatitis C NASH Cirrhosis Aortic atherosclerosis Hyperlipidemia GERD Tobacco abuse  Referred to cardiology and seen by Dr. Raynelle Jan 05/15/2021 for calcification of coronary arteries on CT. Last clinic visit was 11/22/21 at which time she was working on quitting smoking. She was having difficulty with coughing spells that make her feel like her heart is going to come out of her chest. Cholesterol panel revealed LDL 58, near goal of < 55. LFTs were normal. She was advised to return in 6 months for f/u.        History of Present Illness: .   Laura Sloan is a very pleasant 67 y.o. female who is here today for follow-up. Reports overall feeling well. Has occasional feeling like "pin pricking her left chest." This most often occurs when she is sitting still. No associated symptoms with this discomfort. Admits to significant life stress recently with the loss of her brother, stepfather, and a friend all in less than a year's time due to cancer. Occasionally feels heart fluttering when she is lying on left side, about 3 times per week. This resolves after a few seconds and she is able to sleep well. Wakes herself snoring at times, no reported apnea. Is scheduled to have sinus surgery next week.  She denies shortness of breath, orthopnea, PND, presyncope, syncope. We discussed consideration of sleep test if snoring continues post sinus surgery.   ROS: See HPI       Studies Reviewed: Marland Kitchen   EKG Interpretation Date/Time:  Friday November 15 2022 09:58:47 EDT Ventricular Rate:  64 PR Interval:  198 QRS Duration:  64 QT Interval:  390 QTC Calculation: 402 R Axis:   65  Text Interpretation: Normal sinus rhythm  Normal ECG No previous ECGs available No ST abnormality Confirmed by Eligha Bridegroom [phone]) on 11/15/2022 10:17:35 AM     Risk Assessment/Calculations:             Physical Exam:   VS:  BP 118/76   Pulse 64   Ht 5\' 6"  (1.676 m)   Wt 136 lb 12.8 oz (62.1 kg)   LMP  (LMP Unknown)   SpO2 95%   BMI 22.08 kg/m    Wt Readings from Last 3 Encounters:  11/15/22 136 lb 12.8 oz (62.1 kg)  10/16/22 134 lb 6.4 oz (61 kg)  10/10/22 133 lb 6.4 oz (60.5 kg)    GEN: Well nourished, well developed in no acute distress NECK: No JVD; No carotid bruits CARDIAC: RRR, no murmurs, rubs, gallops RESPIRATORY:  Clear to auscultation without rales, wheezing or rhonchi  ABDOMEN: Soft, non-tender, non-distended EXTREMITIES:  No edema; No deformity     ASSESSMENT AND PLAN: .    Coronary artery calcification/Aortic atherosclerosis: Coronary artery calcification and aortic atherosclerosis noted on CT.  She has fleeting chest pains atypical for angina. No dyspnea, or other symptoms concerning for angina. No indication for further ischemic evaluation at this time. We will recheck lipid panel today. Continue simvastatin.   Hyperlipidemia LDL goal < 55: LDL 58 on 11/22/21. We will recheck today.  We discussed the importance of continuing statin therapy despite well controlled LDL.   Snoring: She admits to snoring. No  episodes of apnea to her awareness.  She is scheduled to undergo sinus surgery next week. We will see her back in 6 months to evaluate for need of sleep study.  Tobacco use disorder: Unfortunately, she continues to smoke 1 ppd.  She has tried nicotine patches in the past, but tried them during the summer when they fell off easily.  She has upcoming sinus surgery which would be an ideal time for her to quit smoking.  She is agreeable to try nicotine patches again. Complete cessation advised.        Dispo: 6 months with me or Dr. Izora Ribas  Signed, Eligha Bridegroom, NP-C

## 2022-11-15 ENCOUNTER — Ambulatory Visit: Payer: Medicaid Other | Attending: Nurse Practitioner | Admitting: Nurse Practitioner

## 2022-11-15 ENCOUNTER — Encounter: Payer: Self-pay | Admitting: Nurse Practitioner

## 2022-11-15 VITALS — BP 118/76 | HR 64 | Ht 66.0 in | Wt 136.8 lb

## 2022-11-15 DIAGNOSIS — I700 Atherosclerosis of aorta: Secondary | ICD-10-CM | POA: Diagnosis not present

## 2022-11-15 DIAGNOSIS — Z720 Tobacco use: Secondary | ICD-10-CM

## 2022-11-15 DIAGNOSIS — I2510 Atherosclerotic heart disease of native coronary artery without angina pectoris: Secondary | ICD-10-CM

## 2022-11-15 DIAGNOSIS — E785 Hyperlipidemia, unspecified: Secondary | ICD-10-CM | POA: Diagnosis not present

## 2022-11-15 DIAGNOSIS — R0683 Snoring: Secondary | ICD-10-CM

## 2022-11-15 DIAGNOSIS — I2584 Coronary atherosclerosis due to calcified coronary lesion: Secondary | ICD-10-CM

## 2022-11-15 MED ORDER — NICOTINE 14 MG/24HR TD PT24: 14.0000 mg | MEDICATED_PATCH | Freq: Every day | TRANSDERMAL | 0 refills | Status: DC

## 2022-11-15 MED ORDER — NICOTINE 21 MG/24HR TD PT24: 21.0000 mg | MEDICATED_PATCH | Freq: Every day | TRANSDERMAL | 0 refills | Status: DC

## 2022-11-15 MED ORDER — NICOTINE 7 MG/24HR TD PT24: 7.0000 mg | MEDICATED_PATCH | Freq: Every day | TRANSDERMAL | 0 refills | Status: DC

## 2022-11-15 NOTE — Patient Instructions (Addendum)
Medication Instructions:   Please start Nicotine patches one patch every 24 hours start with 21 mg for 28 days, than 14 mg for 28 days than 7 mg for 28 days.  *If you need a refill on your cardiac medications before your next appointment, please call your pharmacy*   Lab Work:  TODAY!!! LIPID  If you have labs (blood work) drawn today and your tests are completely normal, you will receive your results only by: MyChart Message (if you have MyChart) OR A paper copy in the mail If you have any lab test that is abnormal or we need to change your treatment, we will call you to review the results.   Testing/Procedures:  None ordered.   Follow-Up: At [facility], you and your health needs are our priority.  As part of our continuing mission to provide you with exceptional heart care, we have created designated Provider Care Teams.  These Care Teams include your primary Cardiologist (physician) and Advanced Practice Providers (APPs -  Physician Assistants and Nurse Practitioners) who all work together to provide you with the care you need, when you need it.  We recommend signing up for the patient portal called "MyChart".  Sign up information is provided on this After Visit Summary.  MyChart is used to connect with patients for Virtual Visits (Telemedicine).  Patients are able to view lab/test results, encounter notes, upcoming appointments, etc.  Non-urgent messages can be sent to your provider as well.   To learn more about what you can do with MyChart, go to [email or web address].    Your next appointment:   6 month(s)  Provider:   Eligha Bridegroom, NP         Other Instructions  I will call you or send you a mychart message when Michelle's template opens up with her schedule in March to schedule your appointment.

## 2022-11-16 ENCOUNTER — Other Ambulatory Visit: Payer: Self-pay | Admitting: Nurse Practitioner

## 2022-11-16 DIAGNOSIS — I700 Atherosclerosis of aorta: Secondary | ICD-10-CM

## 2022-11-16 DIAGNOSIS — I2510 Atherosclerotic heart disease of native coronary artery without angina pectoris: Secondary | ICD-10-CM

## 2022-11-16 LAB — LIPID PANEL
Chol/HDL Ratio: 3.1 ratio (ref 0.0–4.4)
Cholesterol, Total: 153 mg/dL (ref 100–199)
HDL: 49 mg/dL (ref >39)
LDL Chol Calc (NIH): 80 mg/dL (ref 0–99)
Triglycerides: 137 mg/dL (ref 0–149)
VLDL Cholesterol Cal: 24 mg/dL (ref 5–40)

## 2022-11-16 MED ORDER — ROSUVASTATIN CALCIUM 20 MG PO TABS: 20.0000 mg | ORAL_TABLET | Freq: Every day | ORAL | 3 refills | Status: DC

## 2022-11-18 ENCOUNTER — Other Ambulatory Visit: Payer: Self-pay | Admitting: *Deleted

## 2022-11-18 DIAGNOSIS — I700 Atherosclerosis of aorta: Secondary | ICD-10-CM

## 2022-11-18 DIAGNOSIS — I2510 Atherosclerotic heart disease of native coronary artery without angina pectoris: Secondary | ICD-10-CM

## 2022-11-18 DIAGNOSIS — E785 Hyperlipidemia, unspecified: Secondary | ICD-10-CM

## 2022-11-19 ENCOUNTER — Emergency Department (HOSPITAL_COMMUNITY)
Admission: EM | Admit: 2022-11-19 | Discharge: 2022-11-19 | Disposition: A | Discharge status: Home / Self Care | Payer: Medicaid Other | Attending: Emergency Medicine | Admitting: Emergency Medicine

## 2022-11-19 ENCOUNTER — Encounter (HOSPITAL_COMMUNITY): Payer: Self-pay

## 2022-11-19 DIAGNOSIS — R040 Epistaxis: Secondary | ICD-10-CM | POA: Diagnosis present

## 2022-11-19 DIAGNOSIS — G8918 Other acute postprocedural pain: Secondary | ICD-10-CM | POA: Insufficient documentation

## 2022-11-19 DIAGNOSIS — Z9889 Other specified postprocedural states: Secondary | ICD-10-CM

## 2022-11-19 LAB — CBC WITH DIFFERENTIAL/PLATELET
Abs Immature Granulocytes: 0.01 10*3/uL (ref 0.00–0.07)
Basophils Absolute: 0 10*3/uL (ref 0.0–0.1)
Basophils Relative: 0 %
Eosinophils Absolute: 0 10*3/uL (ref 0.0–0.5)
Eosinophils Relative: 0 %
HCT: 40.4 % (ref 36.0–46.0)
Hemoglobin: 13.1 g/dL (ref 12.0–15.0)
Immature Granulocytes: 0 %
Lymphocytes Relative: 39 %
Lymphs Abs: 2.7 10*3/uL (ref 0.7–4.0)
MCH: 30.5 pg (ref 26.0–34.0)
MCHC: 32.4 g/dL (ref 30.0–36.0)
MCV: 94.2 fL (ref 80.0–100.0)
Monocytes Absolute: 0.5 10*3/uL (ref 0.1–1.0)
Monocytes Relative: 8 %
Neutro Abs: 3.7 10*3/uL (ref 1.7–7.7)
Neutrophils Relative %: 53 %
Platelets: 96 10*3/uL — ABNORMAL LOW (ref 150–400)
RBC: 4.29 MIL/uL (ref 3.87–5.11)
RDW: 13 % (ref 11.5–15.5)
WBC: 7 10*3/uL (ref 4.0–10.5)
nRBC: 0 % (ref 0.0–0.2)

## 2022-11-19 MED ORDER — OXYMETAZOLINE HCL 0.05 % NA SOLN
1.0000 | Freq: Once | NASAL | Status: AC
  Administered 2022-11-19: 1 via NASAL
  Filled 2022-11-19: qty 30

## 2022-11-19 MED ORDER — OXYCODONE HCL 5 MG PO TABS
5.0000 mg | ORAL_TABLET | Freq: Once | ORAL | Status: AC
  Administered 2022-11-19: 5 mg via ORAL
  Filled 2022-11-19: qty 1

## 2022-11-19 NOTE — Discharge Instructions (Signed)
Thank you for coming to [facility] Emergency Department. You were seen for nosebleed after sinus surgery. Blood oozing from the nose is expected after sinus surgery. Please do not put any dry cotton or tissues up the nose and keep the nasal mucosa moist. Please follow the instructions given by Dr. Jenne Pane post-op. Please use afrin not more than twice in 24 hours in your nose for bleeding.  Please follow up with Dr. Jenne Pane as originally scheduled on 12/02/22.   Do not hesitate to return to the ED or call 911 if you experience: -Worsening symptoms -Significant bleeding from the nose that does not stop with pressure -Lightheadedness, passing out -Fevers/chills -Anything else that concerns you

## 2022-11-19 NOTE — ED Provider Notes (Signed)
[place] EMERGENCY DEPARTMENT AT [facility] Provider Note   CSN: [id number] Arrival date & time: 11/19/22  3244     History  No chief complaint on file.   Laura Sloan is a 67 y.o. female with PMH as listed below who presents with nose bleed since 6pm last night after BL maxillary antrostomy yesterday with Dr. Jenne Pane. No blood thinner but states she has chronic thrombocytopenia. Denies injury. Bleeding controlled at this time. Denies any other concerns. Is on sinus precautions so has not blown her nose but has put cotton balls up the nare to help prevent it. Both sides but L side is worse than right. No lightheadedness. Has felt it bleeding down her throat and has cleared her throat and spit up blood.   Past Medical History:  Diagnosis Date   Anxiety    Arthritis    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 (HCC) 05/2020   followed by ID, Rexene Alberts NP,  dx 03/ 2022,  hx IV drug use 1980s   Cirrhosis of liver (HCC) 05/2020   Family history of adverse reaction to anesthesia    mother-- hard to wake   Full dentures    GERD (gastroesophageal reflux disease)    Hepatitis B core antibody positive    Seasonal allergies    Smokers' cough (HCC)    Vulvar dysplasia    Wears glasses        Home Medications Prior to Admission medications   Medication Sig Start Date End Date Taking? Authorizing Provider  albuterol (VENTOLIN HFA) 108 (90 Base) MCG/ACT inhaler TAKE 2 PUFFS BY MOUTH EVERY 6 HOURS AS NEEDED FOR WHEEZE OR SHORTNESS OF BREATH 12/31/21   Byrum, Les Pou, MD  ascorbic acid (VITAMIN C) 1000 MG tablet Take 500 mg by mouth daily.    [provider]  cyanocobalamin 100 MCG tablet Take 2,000 mcg by mouth daily.    [provider]  FLUoxetine (PROZAC) 10 MG capsule Take 1 capsule (10 mg total) by mouth daily. 10/10/22 01/08/23  Rema Fendt, NP  fluticasone (FLONASE) 50 MCG/ACT nasal spray Place 2 sprays into both nostrils daily. 07/26/21    Leslye Peer, MD  gabapentin (NEURONTIN) 400 MG capsule Take 1 capsule (400 mg total) by mouth at bedtime. 10/16/22   Butch Penny, NP  levocetirizine (XYZAL) 5 MG tablet TAKE 1 TABLET BY MOUTH EVERY DAY IN THE EVENING 01/10/22   Leslye Peer, MD  nicotine (NICODERM CQ - DOSED IN MG/24 HOURS) 14 mg/24hr patch Place 1 patch (14 mg total) onto the skin daily. 11/15/22   Swinyer, Zachary George, NP  nicotine (NICODERM CQ - DOSED IN MG/24 HOURS) 21 mg/24hr patch Place 1 patch (21 mg total) onto the skin daily. Start with 21 mg patch for 28 days, then 14 mg patch for 28 days and then 7 mg patch for 28 days 11/15/22   Swinyer, Zachary George, NP  nicotine (NICODERM CQ - DOSED IN MG/24 HR) 7 mg/24hr patch Place 1 patch (7 mg total) onto the skin daily. 11/15/22   Swinyer, Zachary George, NP  pantoprazole (PROTONIX) 40 MG tablet TAKE 1 TABLET BY MOUTH EVERY DAY 08/05/22   Unk Lightning, PA  rosuvastatin (CRESTOR) 20 MG tablet Take 1 tablet (20 mg total) by mouth daily. 11/16/22   Swinyer, Zachary George, NP  SUMAtriptan (IMITREX) 25 MG tablet TAKE 25 MG (1 TABLET) BY MOUTH AT THE START OF THE HEADACHE. MAY REPEAT IN 2 HOURS X  1 IF HEADACHE PERSISTS. MAX OF 2 TABS/24 HOURS. 06/06/21   Rema Fendt, NP  Vitamin D, Ergocalciferol, (DRISDOL) 1.25 MG (50000 UNIT) CAPS capsule Take 50,000 Units by mouth every 7 (seven) days.    [provider]      Allergies    Patient has no known allergies.    Review of Systems   Review of Systems A 10 point review of systems was performed and is negative unless otherwise reported in HPI.  Physical Exam Updated Vital Signs BP 122/70   Pulse 70   Temp 99 F (37.2 C) (Oral)   Resp 16   LMP  (LMP Unknown)   SpO2 100%  Physical Exam General: Normal appearing female, lying in bed.  HEENT: Sclera anicteric, MMM, trachea midline. Mild TTP to maxillary sinuses. No rhinorrhea. Mild oozing from L nare. Cardiology: RRR, no murmurs/rubs/gallops.  Resp: Normal  respiratory rate and effort. CTAB, no wheezes, rhonchi, crackles.  Abd: Soft, non-tender, non-distended. No rebound tenderness or guarding.  GU: Deferred. MSK: No peripheral edema or signs of trauma. Skin: warm, dry.  Neuro: A&Ox4, CNs II-XII grossly intact. MAEs. Sensation grossly intact.  Psych: Normal mood and affect.   ED Results / Procedures / Treatments   Labs (all labs ordered are listed, but only abnormal results are displayed) Labs Reviewed  CBC WITH DIFFERENTIAL/PLATELET - Abnormal; Notable for the following components:      Result Value   Platelets 96 (*)    All other components within normal limits    EKG None  Radiology No results found.  Procedures Procedures    Medications Ordered in ED Medications  oxymetazoline (AFRIN) 0.05 % nasal spray 1 spray (1 spray Each Nare Given 11/19/22 1117)  oxyCODONE (Oxy IR/ROXICODONE) immediate release tablet 5 mg (5 mg Oral Given 11/19/22 1250)    ED Course/ Medical Decision Making/ A&P                          Medical Decision Making Amount and/or Complexity of Data Reviewed Labs: ordered.  Risk OTC drugs. Prescription drug management.    This patient presents to the ED for concern of epistaxis post-op sinus antrostomy, this involves an extensive number of treatment options, and is a complaint that carries with it a high risk of complications and morbidity.  I considered the following differential and admission for this acute, potentially life threatening condition. Pt is overall well-appearing and HDS.  MDM:    Patient is post sinus surgery with very mild unilateral epistaxis.  She denies any lightheadedness and is very vitally stable, normotensive, no concern for hemorrhage or significant hypovolemia to cause shock.  Will test her hemoglobin to assess for acute blood loss anemia.  Patient has a known history of thrombocytopenia which could contribute and will test her platelets today if they are low may need a  platelet transfusion.  However they have been stable around >100.  She does not take a blood thinner.  Her bleeding now is mild and I have placed patient in a sniffing position with pressure on the nares.  Will consult with ENT given that she is postop and discuss any specific recommendations or expectations.   Clinical Course as of 11/22/22 1451  Tue Nov 19, 2022  1041 D/w Dr. Jenne Pane w/ [facility]. He states that epistaxis is expected post-op. Can soak inside of nose w/ afrin and repeat. If bleeding slows down, can be discharged as this is expected. [  HN]  1222 Soaked gauze w/ afrin and placed in nare, after removed patient with decreased bleeding. She still has some minor oozing. Patient without lightheadedness. Her platelets are 96 and stable, hemoglobin is 13 down from 15 two months ago, but bleeding is expected and is slowed to minor oozing. Patient is discharged w/ afrin per Dr. Jenne Pane and given instructions to maintain sinus precautions, not put any dry tissues or cotton balls into her nares, keep nasal mucosa moist, and to return if bleeding worsens. Has f/u with ENT scheduled 12/02/22.  [HN]    Clinical Course User Index [HN] Loetta Rough, MD    Labs: I Ordered, and personally interpreted labs.  The pertinent results include: Those listed above  Additional history obtained from chart review.  External records from outside source obtained and reviewed including [facility]  Reevaluation: After the interventions noted above, I reevaluated the patient and found that they have :improved  Social Determinants of Health: Lives independently  Disposition:  DC w/ discharge instructions/return precautions. All questions answered to patient's satisfaction.    Co morbidities that complicate the patient evaluation  Past Medical History:  Diagnosis Date   Anxiety    Arthritis    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 (HCC) 05/2020   followed by ID, Rexene Alberts NP,  dx 03/  2022,  hx IV drug use 1980s   Cirrhosis of liver (HCC) 05/2020   Family history of adverse reaction to anesthesia    mother-- hard to wake   Full dentures    GERD (gastroesophageal reflux disease)    Hepatitis B core antibody positive    Seasonal allergies    Smokers' cough (HCC)    Vulvar dysplasia    Wears glasses      Medicines Meds ordered this encounter  Medications   oxymetazoline (AFRIN) 0.05 % nasal spray 1 spray   oxyCODONE (Oxy IR/ROXICODONE) immediate release tablet 5 mg    I have reviewed the patients home medicines and have made adjustments as needed  Problem List / ED Course: Problem List Items Addressed This Visit   None Visit Diagnoses     Mild epistaxis    -  Primary   Post-operative state                       This note was created using dictation software, which may contain spelling or grammatical errors.    Loetta Rough, MD 11/22/22 [phone]

## 2022-11-19 NOTE — ED Triage Notes (Signed)
Pt reports nose bleed since 6pm last night after Sinus surgery yesterday. No blood thinner. Denies injury. Bleeding controlled at this time. Denies any other concerns

## 2022-11-28 ENCOUNTER — Other Ambulatory Visit: Payer: Self-pay | Admitting: *Deleted

## 2022-11-28 DIAGNOSIS — I872 Venous insufficiency (chronic) (peripheral): Secondary | ICD-10-CM

## 2022-12-05 ENCOUNTER — Ambulatory Visit: Payer: Medicaid Other | Admitting: Physician Assistant

## 2022-12-05 ENCOUNTER — Ambulatory Visit (HOSPITAL_COMMUNITY)
Admission: RE | Admit: 2022-12-05 | Discharge: 2022-12-05 | Disposition: A | Discharge status: Home / Self Care | Payer: Medicaid Other | Source: Ambulatory Visit | Attending: Vascular Surgery | Admitting: Vascular Surgery

## 2022-12-05 VITALS — BP 116/72 | HR 57 | Temp 98.1°F | Ht 66.0 in | Wt 135.9 lb

## 2022-12-05 DIAGNOSIS — G629 Polyneuropathy, unspecified: Secondary | ICD-10-CM

## 2022-12-05 DIAGNOSIS — I872 Venous insufficiency (chronic) (peripheral): Secondary | ICD-10-CM
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
## 2022-12-06 ENCOUNTER — Encounter: Payer: Medicaid Other | Admitting: Vascular Surgery

## 2022-12-06 ENCOUNTER — Encounter (HOSPITAL_COMMUNITY): Payer: Medicaid Other

## 2022-12-12 ENCOUNTER — Other Ambulatory Visit: Payer: Self-pay | Admitting: Nurse Practitioner

## 2022-12-16 ENCOUNTER — Ambulatory Visit: Payer: Medicaid Other | Admitting: Neurology

## 2022-12-16 ENCOUNTER — Ambulatory Visit (INDEPENDENT_AMBULATORY_CARE_PROVIDER_SITE_OTHER): Payer: Medicaid Other | Admitting: Neurology

## 2022-12-16 DIAGNOSIS — G63 Polyneuropathy in diseases classified elsewhere: Secondary | ICD-10-CM | POA: Diagnosis not present

## 2022-12-16 DIAGNOSIS — D472 Monoclonal gammopathy: Secondary | ICD-10-CM

## 2022-12-16 DIAGNOSIS — L52 Erythema nodosum: Secondary | ICD-10-CM

## 2022-12-16 DIAGNOSIS — G629 Polyneuropathy, unspecified: Secondary | ICD-10-CM
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
## 2022-12-16 NOTE — Patient Instructions (Addendum)
Likely small fiber neuropathy, gave information today Sent message to pcp Reviewed infectious disease notes, see from 2023 Follow up with primary care, dermatology Pain management for the pain in the feet  Erythema Nodosum  Erythema nodosum is a skin condition in which patches of fat under the skin of the lower legs become inflamed. This causes painful bumps (nodules) to form. What are the causes? This condition may be caused by: Infections. Strep throat (pharyngitis) is a common cause. Certain medicines, especially birth control pills, penicillin, and sulfa medicines. Sarcoidosis. Pregnancy. Certain inflammatory conditions, such as Crohn's disease. Certain cancers. In some cases, the cause may not be known. What increases the risk? This condition is more likely to develop in young adult women. This may be related to birth control pills. What are the signs or symptoms? The main symptom of this condition is the development of nodules that: Look like raised bruises and are tender to the touch. Usually appear on the front of the lower legs (shins) and may also appear on the arms or the abdomen. Gradually change in color from pink to brown. Leave a dark mark that fades away after several months. Other symptoms besides nodules may include: Fever. Tiredness(fatigue). Joint pain. How is this diagnosed? This condition may be diagnosed based on: Your symptoms and your medical history. A physical exam. Tests, such as: Blood tests. X-rays. A skin sample may be removed (skin biopsy) to be examined by a specialist (pathologist). How is this treated? Treatment for this condition depends on the cause. The nodules usually go away after the underlying cause is treated, such as after medicine is used to fight infection. Treatment may also include: NSAIDs, such as ibuprofen, for pain and inflammation. Potassium iodide supplements. Steroid medicines. Resting and raising (elevating) the  affected legs. Follow these instructions at home: Medicines Take over-the-counter and prescription medicines only as told by your health care provider. If you were prescribed an antibiotic medicine, take or apply it as told by your health care provider. Do not stop using the antibiotic even if you start to feel better. Managing pain, stiffness, and swelling  If directed, put ice on the affected area. To do this: Put ice in a plastic bag. Place a towel between your skin and the bag. Leave the ice on for 20 minutes, 2-3 times a day. Remove the ice if your skin turns bright red. This is very important. If you cannot feel pain, heat, or cold, you have a greater risk of damage to the area. Elevate the affected area above the level of your heart while you are sitting or lying down. Wear a compression bandage as told by your health care provider. Activity Rest and return to your normal activities as told by your health care provider. Ask your health care provider what activities are safe for you. Avoid very intense (vigorous) exercise until your symptoms go away. General instructions Avoid scratching your skin. To relieve itchiness, make a paste with dry oatmeal and warm water, then put the paste on itchy areas. Let the paste dry, remove it, and then apply moisturizer. You may do this 2-3 times a day, or as needed. Keep all follow-up visits. This is important. Contact a health care provider if you: Have symptoms that do not get better with treatment and home care. Have a fever that does not go away. Vomit more than one time. Have pain that gets worse. Have a sore throat. Summary Erythema nodosum is a skin condition that causes painful  bumps (nodules) to form. The bumps usually appear on the front of the lower legs (shins). Avoid very intense (vigorous) exercise until your symptoms go away. Contact a health care provider if you have a fever or if your symptoms do not improve. This information  is not intended to replace advice given to you by your health care provider. Make sure you discuss any questions you have with your health care provider. Document Revised: 05/03/2020 Document Reviewed: 05/03/2020 Elsevier Patient Education  2024 [employer].
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
## 2023-01-01 ENCOUNTER — Encounter: Payer: Self-pay | Admitting: Physical Medicine & Rehabilitation

## 2023-01-16 ENCOUNTER — Encounter: Payer: Medicaid Other | Attending: Physical Medicine & Rehabilitation | Admitting: Physical Medicine & Rehabilitation

## 2023-01-16 ENCOUNTER — Telehealth: Payer: Self-pay

## 2023-01-16 ENCOUNTER — Encounter: Payer: Self-pay | Admitting: Physical Medicine & Rehabilitation

## 2023-01-16 VITALS — BP 126/80 | HR 73 | Ht 66.0 in | Wt 136.0 lb

## 2023-01-16 DIAGNOSIS — M79671 Pain in right foot: Secondary | ICD-10-CM

## 2023-01-16 DIAGNOSIS — L52 Erythema nodosum: Secondary | ICD-10-CM | POA: Insufficient documentation

## 2023-01-16 DIAGNOSIS — G629 Polyneuropathy, unspecified: Secondary | ICD-10-CM | POA: Insufficient documentation

## 2023-01-16 DIAGNOSIS — K7460 Unspecified cirrhosis of liver: Secondary | ICD-10-CM | POA: Diagnosis not present

## 2023-01-16 MED ORDER — QUTENZA (2 PATCH) 8 % EX KIT: 2.0000 | PACK | Freq: Once | CUTANEOUS | 0 refills | Status: AC

## 2023-01-16 MED ORDER — GABAPENTIN 400 MG PO CAPS
400.0000 mg | ORAL_CAPSULE | Freq: Three times a day (TID) | ORAL | 3 refills | Status: DC
Start: 2023-01-16 — End: 2023-03-19
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
## 2023-01-16 NOTE — Telephone Encounter (Signed)
Rx sent to specialty pharmacy

## 2023-01-19 ENCOUNTER — Other Ambulatory Visit: Payer: Self-pay | Admitting: Family

## 2023-01-19 DIAGNOSIS — F32A Depression, unspecified: Secondary | ICD-10-CM

## 2023-01-20 ENCOUNTER — Other Ambulatory Visit: Payer: Self-pay

## 2023-01-20 DIAGNOSIS — F419 Anxiety disorder, unspecified: Secondary | ICD-10-CM

## 2023-01-20 MED ORDER — FLUOXETINE HCL 10 MG PO CAPS: 10.0000 mg | ORAL_CAPSULE | Freq: Every day | ORAL | 0 refills | Status: DC

## 2023-01-20 NOTE — Telephone Encounter (Signed)
Approved valid through 01/16/24

## 2023-01-20 NOTE — Telephone Encounter (Signed)
[place] SUBMITTED FOR QUTENZA PATCHES   Lloyd Leonhard (Key: [id number])

## 2023-01-22 ENCOUNTER — Telehealth: Payer: Self-pay | Admitting: *Deleted

## 2023-01-22 NOTE — Telephone Encounter (Signed)
[facility] called and will deliver Qutenza patch 02/04/23.

## 2023-02-01 ENCOUNTER — Other Ambulatory Visit: Payer: Self-pay | Admitting: Physician Assistant

## 2023-02-05 ENCOUNTER — Ambulatory Visit: Payer: Medicaid Other | Attending: Nurse Practitioner

## 2023-02-05 DIAGNOSIS — E785 Hyperlipidemia, unspecified: Secondary | ICD-10-CM

## 2023-02-05 DIAGNOSIS — I2510 Atherosclerotic heart disease of native coronary artery without angina pectoris: Secondary | ICD-10-CM

## 2023-02-05 DIAGNOSIS — I700 Atherosclerosis of aorta: Secondary | ICD-10-CM

## 2023-02-06 ENCOUNTER — Encounter: Payer: Self-pay | Admitting: Physical Medicine & Rehabilitation

## 2023-02-06 ENCOUNTER — Encounter
Payer: Medicare (Managed Care) | Attending: Physical Medicine & Rehabilitation | Admitting: Physical Medicine & Rehabilitation

## 2023-02-06 VITALS — BP 130/71 | HR 64 | Ht 66.0 in | Wt 137.0 lb

## 2023-02-06 DIAGNOSIS — K7460 Unspecified cirrhosis of liver: Secondary | ICD-10-CM | POA: Insufficient documentation

## 2023-02-06 DIAGNOSIS — G629 Polyneuropathy, unspecified: Secondary | ICD-10-CM | POA: Insufficient documentation

## 2023-02-06 LAB — LIPID PANEL
Chol/HDL Ratio: 2.2 {ratio} (ref 0.0–4.4)
Cholesterol, Total: 122 mg/dL (ref 100–199)
HDL: 56 mg/dL (ref >39)
LDL Chol Calc (NIH): 52 mg/dL (ref 0–99)
Triglycerides: 65 mg/dL (ref 0–149)
VLDL Cholesterol Cal: 14 mg/dL (ref 5–40)

## 2023-02-06 MED ORDER — CAPSAICIN-CLEANSING GEL 8 % EX KIT
4.0000 | PACK | Freq: Once | CUTANEOUS | Status: AC
Start: 2023-02-06 — End: 2023-02-06
  Administered 2023-02-06: 4 via TOPICAL
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
## 2023-02-07 ENCOUNTER — Ambulatory Visit: Payer: Medicaid Other | Admitting: Physical Medicine & Rehabilitation

## 2023-02-12 ENCOUNTER — Encounter: Payer: Self-pay | Admitting: Family

## 2023-02-12 ENCOUNTER — Ambulatory Visit (INDEPENDENT_AMBULATORY_CARE_PROVIDER_SITE_OTHER): Payer: Medicare (Managed Care) | Admitting: Family

## 2023-02-12 VITALS — BP 128/87 | HR 69 | Temp 98.3°F | Ht 66.0 in | Wt 138.0 lb

## 2023-02-12 DIAGNOSIS — Z13228 Encounter for screening for other metabolic disorders: Secondary | ICD-10-CM | POA: Diagnosis not present

## 2023-02-12 DIAGNOSIS — F17210 Nicotine dependence, cigarettes, uncomplicated: Secondary | ICD-10-CM | POA: Diagnosis not present
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
## 2023-02-12 NOTE — Progress Notes (Signed)
Patient states no other concerns to discuss.   Patient said she needs blood work done to see if her numbers are high so she can get a liver ultrasound done.

## 2023-02-13 ENCOUNTER — Other Ambulatory Visit: Payer: Self-pay | Admitting: Family

## 2023-02-13 ENCOUNTER — Encounter: Payer: Self-pay | Admitting: Family

## 2023-02-13 DIAGNOSIS — R748 Abnormal levels of other serum enzymes: Secondary | ICD-10-CM

## 2023-02-13 LAB — AST: AST: 79 [IU]/L — ABNORMAL HIGH (ref 0–40)

## 2023-02-13 LAB — ALT: ALT: 54 [IU]/L — ABNORMAL HIGH (ref 0–32)

## 2023-02-19 ENCOUNTER — Other Ambulatory Visit: Payer: Self-pay | Admitting: Medical Genetics

## 2023-02-19 ENCOUNTER — Encounter (HOSPITAL_BASED_OUTPATIENT_CLINIC_OR_DEPARTMENT_OTHER): Payer: Self-pay

## 2023-02-24 ENCOUNTER — Telehealth: Payer: Self-pay | Admitting: Gastroenterology

## 2023-02-24 NOTE — Telephone Encounter (Signed)
Patient needs a refill for her Pantoprazole sent to [facility] [address]  Appt scheduled on 03/19/2023

## 2023-02-27 MED ORDER — PANTOPRAZOLE SODIUM 40 MG PO TBEC: 40.0000 mg | DELAYED_RELEASE_TABLET | Freq: Every day | ORAL | 7 refills | Status: DC

## 2023-02-27 NOTE — Telephone Encounter (Signed)
Refills sent as requested

## 2023-03-19 ENCOUNTER — Other Ambulatory Visit: Payer: No Typology Code available for payment source

## 2023-03-19 ENCOUNTER — Encounter: Payer: Self-pay | Admitting: Gastroenterology

## 2023-03-19 ENCOUNTER — Ambulatory Visit: Payer: No Typology Code available for payment source | Admitting: Gastroenterology

## 2023-03-19 VITALS — BP 118/76 | HR 67 | Ht 66.0 in | Wt 141.4 lb

## 2023-03-19 DIAGNOSIS — K219 Gastro-esophageal reflux disease without esophagitis: Secondary | ICD-10-CM | POA: Diagnosis not present

## 2023-03-19 DIAGNOSIS — K7460 Unspecified cirrhosis of liver: Secondary | ICD-10-CM | POA: Diagnosis not present

## 2023-03-19 DIAGNOSIS — I700 Atherosclerosis of aorta: Secondary | ICD-10-CM | POA: Diagnosis not present

## 2023-03-19 DIAGNOSIS — I2510 Atherosclerotic heart disease of native coronary artery without angina pectoris: Secondary | ICD-10-CM

## 2023-03-19 LAB — COMPREHENSIVE METABOLIC PANEL
ALT: 65 U/L — ABNORMAL HIGH (ref 0–35)
AST: 74 U/L — ABNORMAL HIGH (ref 0–37)
Albumin: 4.1 g/dL (ref 3.5–5.2)
Alkaline Phosphatase: 85 U/L (ref 39–117)
BUN: 14 mg/dL (ref 6–23)
CO2: 30 meq/L (ref 19–32)
Calcium: 9.6 mg/dL (ref 8.4–10.5)
Chloride: 103 meq/L (ref 96–112)
Creatinine, Ser: 0.69 mg/dL (ref 0.40–1.20)
GFR: 91.28 mL/min (ref >60.00)
Glucose, Bld: 98 mg/dL (ref 70–99)
Potassium: 4.1 meq/L (ref 3.5–5.1)
Sodium: 139 meq/L (ref 135–145)
Total Bilirubin: 0.4 mg/dL (ref 0.2–1.2)
Total Protein: 7.7 g/dL (ref 6.0–8.3)

## 2023-03-19 LAB — CBC WITH DIFFERENTIAL/PLATELET
Basophils Absolute: 0 10*3/uL (ref 0.0–0.1)
Basophils Relative: 0.7 % (ref 0.0–3.0)
Eosinophils Absolute: 0.1 10*3/uL (ref 0.0–0.7)
Eosinophils Relative: 4 % (ref 0.0–5.0)
HCT: 42.2 % (ref 36.0–46.0)
Hemoglobin: 14 g/dL (ref 12.0–15.0)
Lymphocytes Relative: 52.1 % — ABNORMAL HIGH (ref 12.0–46.0)
Lymphs Abs: 1.4 10*3/uL (ref 0.7–4.0)
MCHC: 33.2 g/dL (ref 30.0–36.0)
MCV: 88.2 fL (ref 78.0–100.0)
Monocytes Absolute: 0.3 10*3/uL (ref 0.1–1.0)
Monocytes Relative: 10.7 % (ref 3.0–12.0)
Neutro Abs: 0.9 10*3/uL — ABNORMAL LOW (ref 1.4–7.7)
Neutrophils Relative %: 32.5 % — ABNORMAL LOW (ref 43.0–77.0)
Platelets: 104 10*3/uL — ABNORMAL LOW (ref 150.0–400.0)
RBC: 4.79 Mil/uL (ref 3.87–5.11)
RDW: 13.2 % (ref 11.5–15.5)
WBC: 2.8 10*3/uL — ABNORMAL LOW (ref 4.0–10.5)

## 2023-03-19 LAB — PROTIME-INR
INR: 1.2 {ratio} — ABNORMAL HIGH (ref 0.8–1.0)
Prothrombin Time: 12.7 s (ref 9.6–13.1)

## 2023-03-19 MED ORDER — PANTOPRAZOLE SODIUM 40 MG PO TBEC: 40.0000 mg | DELAYED_RELEASE_TABLET | Freq: Every day | ORAL | 11 refills | Status: AC
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
## 2023-03-19 NOTE — Patient Instructions (Signed)
 Your provider has requested that you go to the basement level for lab work before leaving today. Press "B" on the elevator. The lab is located at the first door on the left as you exit the elevator.  You have been scheduled for an abdominal ultrasound at [facility] (1st floor of hospital) on 03/20/23 at 10 am. Please arrive 30 minutes prior to your appointment for registration. Make certain not to have anything to eat or drink 6 hours prior to your appointment. Should you need to reschedule your appointment, please contact radiology at [phone]. This test typically takes about 30 minutes to perform.  We have sent the following medications to your pharmacy for you to pick up at your convenience: Pantoprazole    _______________________________________________________  If your blood pressure at your visit was 140/90 or greater, please contact your primary care physician to follow up on this.  _______________________________________________________  If you are age 74 or older, your body mass index should be between 23-30. Your Body mass index is 22.82 kg/m. If this is out of the aforementioned range listed, please consider follow up with your Primary Care Provider.  If you are age 66 or younger, your body mass index should be between 19-25. Your Body mass index is 22.82 kg/m. If this is out of the aformentioned range listed, please consider follow up with your Primary Care Provider.   ________________________________________________________  The [facility] providers would like to encourage you to use MYCHART to communicate with providers for non-urgent requests or questions.  Due to long hold times on the telephone, sending your provider a message by [facility] may be a faster and more efficient way to get a response.  Please allow 48 business hours for a response.  Please remember that this is for non-urgent requests.  _______________________________________________________  Due to  recent changes in healthcare laws, you may see the results of your imaging and laboratory studies on MyChart before your provider has had a chance to review them.  We understand that in some cases there may be results that are confusing or concerning to you. Not all laboratory results come back in the same time frame and the provider may be waiting for multiple results in order to interpret others.  Please give us  48 hours in order for your provider to thoroughly review all the results before contacting the office for clarification of your results.    Thank you for entrusting me with your care and choosing [facility].  Bayley Luan Rumpf, PA-C

## 2023-03-20 ENCOUNTER — Ambulatory Visit (HOSPITAL_COMMUNITY)
Admission: RE | Admit: 2023-03-20 | Discharge: 2023-03-20 | Disposition: A | Discharge status: Home / Self Care | Payer: No Typology Code available for payment source | Source: Ambulatory Visit | Attending: Gastroenterology | Admitting: Gastroenterology

## 2023-03-20 DIAGNOSIS — K219 Gastro-esophageal reflux disease without esophagitis: Secondary | ICD-10-CM

## 2023-03-20 DIAGNOSIS — K7460 Unspecified cirrhosis of liver: Secondary | ICD-10-CM | POA: Diagnosis not present

## 2023-03-20 DIAGNOSIS — B189 Chronic viral hepatitis, unspecified: Secondary | ICD-10-CM | POA: Diagnosis not present

## 2023-03-20 DIAGNOSIS — K7689 Other specified diseases of liver: Secondary | ICD-10-CM | POA: Diagnosis not present

## 2023-03-20 DIAGNOSIS — R932 Abnormal findings on diagnostic imaging of liver and biliary tract: Secondary | ICD-10-CM | POA: Diagnosis not present

## 2023-03-20 LAB — LIPID PANEL
Chol/HDL Ratio: 2.8 {ratio} (ref 0.0–4.4)
Cholesterol, Total: 131 mg/dL (ref 100–199)
HDL: 47 mg/dL (ref >39)
LDL Chol Calc (NIH): 66 mg/dL (ref 0–99)
Triglycerides: 98 mg/dL (ref 0–149)
VLDL Cholesterol Cal: 18 mg/dL (ref 5–40)

## 2023-03-20 LAB — ALT: ALT: 69 [IU]/L — ABNORMAL HIGH (ref 0–32)

## 2023-04-20 ENCOUNTER — Other Ambulatory Visit: Payer: Self-pay | Admitting: Family

## 2023-04-20 DIAGNOSIS — F419 Anxiety disorder, unspecified: Secondary | ICD-10-CM

## 2023-04-23 NOTE — Telephone Encounter (Signed)
 Complete

## 2023-04-25 ENCOUNTER — Other Ambulatory Visit: Payer: Self-pay | Admitting: Physical Medicine & Rehabilitation

## 2023-04-29 ENCOUNTER — Other Ambulatory Visit: Payer: Self-pay | Admitting: Adult Health

## 2023-05-08 ENCOUNTER — Ambulatory Visit: Payer: Medicaid Other | Admitting: Physical Medicine & Rehabilitation

## 2023-05-12 ENCOUNTER — Ambulatory Visit: Payer: Medicaid Other | Admitting: Physical Medicine & Rehabilitation

## 2023-05-19 ENCOUNTER — Encounter: Payer: Medicaid Other | Admitting: Physical Medicine & Rehabilitation

## 2023-05-26 NOTE — Progress Notes (Signed)
 Cardiology Office Note:  .   Date:  06/02/2023  ID:  Laura Sloan, DOB 12-Jun-1955, MRN [id number] PCP: Laura Fendt, NP  Laura Sloan:  Laura Constant, MD    Patient Profile: .      PMH Remote history of substance abuse Hepatitis B Hepatitis C NASH Cirrhosis Aortic atherosclerosis Hyperlipidemia GERD Tobacco abuse  Referred to cardiology and seen by Dr. Raynelle Jan 05/15/2021 for calcification of coronary arteries on CT. Last clinic visit was 11/22/21 at which time she was working on quitting smoking. Was having difficulty with coughing spells that make her feel like her heart is going to come out of her chest. Cholesterol panel revealed LDL 58, near goal of < 55. LFTs were normal. She was advised to return in 6 months for f/u.   Seen by me on 11/15/2022 at which time she described "pin pricking her left chest" most often occurring when sitting still. No associated symptoms with this discomfort. Admits to significant life stress recently with the loss of her brother, stepfather, and a friend all in less than a year's time due to cancer. Occasionally feels heart fluttering when she is lying on left side, about 3 times per week which resolves after a few seconds and she is able to sleep well. Wakes herself snoring at times, no reported apnea. Is scheduled to have sinus surgery next week. We discussed consideration of sleep test if snoring continues post sinus surgery.        History of Present Illness: .   Laura Sloan is a very pleasant 68 y.o. female who is here today for follow-up of coronary calcification. She reports she is having chest pain described as 'a screwdriver twisting' over the left breast. The pain, which has been occurring daily for the past five days, lasts for about a minute or two and then subsides. It typically occurs when the patient is active, such as when potting plants or bending over, and is not associated with n/v,  diaphoresis or shortness of breath. Pain does not occur at rest. Reports burning leg pain but no pain with walking. She reports swelling in legs and feet, bruising, and discoloration. She stopped taking B12 and vitamin D supplements because she thought they were causing weight loss. She is frustrated with the number of medications she is currently taking and expresses a desire to reduce them.  She unfortunately started back smoking after a 3-week hiatus post sinus surgery.  Previous use of patches and gum has not been helpful. She admits to life stress with caring for her mother who just got a pacemaker at age 37. Patient denies shortness of breath, orthopnea, PND, palpitations, presyncope, syncope.   ROS: See HPI       Studies Reviewed: Marland Kitchen   EKG Interpretation Date/Time:  Monday June 02 2023 08:05:39 EDT Ventricular Rate:  63 PR Interval:  198 QRS Duration:  66 QT Interval:  404 QTC Calculation: 413 R Axis:   41  Text Interpretation: Sinus rhythm with Premature supraventricular complexes Cannot rule out Anterior infarct , age undetermined When compared with ECG of 15-Nov-2022 09:58, Premature supraventricular complexes are now Present Confirmed by Laura Sloan [phone]) on 06/02/2023 8:09:43 AM     Risk Assessment/Calculations:             Physical Exam:   VS:  BP 118/72   Pulse 64   Ht 5\' 6"  (1.676 m)   Wt 142 lb 12.8 oz (64.8 kg)  LMP  (LMP Unknown)   SpO2 97%   BMI 23.05 kg/m    Wt Readings from Last 3 Encounters:  06/02/23 142 lb 12.8 oz (64.8 kg)  03/19/23 141 lb 6 oz (64.1 kg)  02/12/23 138 lb (62.6 kg)    GEN: Well nourished, well developed in no acute distress NECK: No JVD; No carotid bruits CARDIAC: RRR, no murmurs, rubs, gallops RESPIRATORY:  Clear to auscultation without rales, wheezing or rhonchi  ABDOMEN: Soft, non-tender, non-distended EXTREMITIES:  No edema; No deformity     ASSESSMENT AND PLAN: .    Chest pain/Abnormal EKG: Midsternal chest discomfort  described as stabbing pain that occurs with exertion and improves with rest.  No associated symptoms of shortness of breath, diaphoresis, n/v.  EKG today reveals possible previous anterior infarct, age undetermined, however no significant change in anterior leads in comparison to previous EKG. Coronary artery calcification noted on previous CT.  No history of prior ischemia evaluation.  We will get coronary CTA for ischemia evaluation.  Will have her take Lopressor 50 mg 2 hours prior to test.  LDL cholesterol is well-controlled.  Continue rosuvastatin.  PVCs: PVCs noted on EKG today. She denies palpitations. We are getting coronary CTA for ischemia evaluation as noted above. We will continue to monitor clinically for now.   Coronary artery calcification/Aortic atherosclerosis: Coronary artery calcification and aortic atherosclerosis noted on CT.  We are getting CTA for ischemia evaluation as noted above due to symptom of chest pain concerning for angina.    Hyperlipidemia LDL goal < 70: Lipid panel completed 03/19/2023 with total cholesterol 131, triglycerides 98, HDL 47, and LDL-C 66. She is tolerating rosuvastatin without significant side effect.  ALT is elevated but stable with baseline. She has history of cirrhosis. We will continue rosuvastatin for now.   Snoring: Admits to snoring, is awakened by her partner  No episodes of apnea to her awareness.  She is scheduled to undergo sinus surgery next week. We will see her back in 6 months to evaluate for need of sleep study.  Tobacco use disorder:  Unfortunately, she resumed smoking after quitting for 3 weeks following sinus surgery. Complete cessation advised.        Disposition: 2 months with me  Signed, Laura Bridegroom, NP-C

## 2023-06-02 ENCOUNTER — Encounter: Payer: Self-pay | Admitting: Nurse Practitioner

## 2023-06-02 ENCOUNTER — Ambulatory Visit: Payer: Medicaid Other | Attending: Nurse Practitioner | Admitting: Nurse Practitioner

## 2023-06-02 VITALS — BP 118/72 | HR 64 | Ht 66.0 in | Wt 142.8 lb

## 2023-06-02 DIAGNOSIS — R072 Precordial pain: Secondary | ICD-10-CM | POA: Diagnosis not present

## 2023-06-02 DIAGNOSIS — I700 Atherosclerosis of aorta: Secondary | ICD-10-CM

## 2023-06-02 DIAGNOSIS — R0683 Snoring: Secondary | ICD-10-CM

## 2023-06-02 DIAGNOSIS — R9431 Abnormal electrocardiogram [ECG] [EKG]: Secondary | ICD-10-CM | POA: Diagnosis not present

## 2023-06-02 DIAGNOSIS — Z720 Tobacco use: Secondary | ICD-10-CM | POA: Diagnosis not present

## 2023-06-02 DIAGNOSIS — I2510 Atherosclerotic heart disease of native coronary artery without angina pectoris: Secondary | ICD-10-CM

## 2023-06-02 DIAGNOSIS — E785 Hyperlipidemia, unspecified: Secondary | ICD-10-CM | POA: Diagnosis not present

## 2023-06-02 MED ORDER — METOPROLOL TARTRATE 50 MG PO TABS: ORAL_TABLET | ORAL | 0 refills | Status: DC

## 2023-06-02 NOTE — Patient Instructions (Signed)
 Medication Instructions:   Your physician recommends that you continue on your current medications as directed. Please refer to the Current Medication list given to you today.   *If you need a refill on your cardiac medications before your next appointment, please call your pharmacy*  Lab Work:  None ordered.  If you have labs (blood work) drawn today and your tests are completely normal, you will receive your results only by: MyChart Message (if you have MyChart) OR A paper copy in the mail If you have any lab test that is abnormal or we need to change your treatment, we will call you to review the results.  Testing/Procedures:    Your cardiac CT will be scheduled at one of the below locations:   [facility] [address] [place], [place] [address] [phone]   If scheduled at [facility], please arrive at the [facility] and Children's Entrance (Entrance C2) of [facility] 30 minutes prior to test start time. You can use the FREE valet parking offered at entrance C (encouraged to control the heart rate for the test)  Proceed to the [facility] Radiology Department (first floor) to check-in and test prep.  All radiology patients and guests should use entrance C2 at [facility], accessed from [facility], even though the hospital's physical address listed is [address]    Please follow these instructions carefully (unless otherwise directed):  An IV will be required for this test and Nitroglycerin will be given.    On the Night Before the Test: Be sure to Drink plenty of water. Do not consume any caffeinated/decaffeinated beverages or chocolate 12 hours prior to your test. Do not take any antihistamines 12 hours prior to your test.  On the Day of the Test: Drink plenty of water until 1 hour prior to the test. Do not eat any food 1 hour prior to test. You may take your regular medications prior to the test.   Take metoprolol (Lopressor) one (1) tablet by mouth two hours prior to test. FEMALES- please wear underwire-free bra if available, avoid dresses & tight clothing  After the Test: Drink plenty of water. After receiving IV contrast, you may experience a mild flushed feeling. This is normal. On occasion, you may experience a mild rash up to 24 hours after the test. This is not dangerous. If this occurs, you can take Benadryl 25 mg, Zyrtec, Claritin, or Allegra and increase your fluid intake.If you experience trouble breathing, this can be serious. If it is severe call 911 IMMEDIATELY. If it is mild, please call our office.  We will call to schedule your test 2-4 weeks out understanding that some insurance companies will need an authorization prior to the service being performed.   For more information and frequently asked questions, please visit our website : [email or web address]  For non-scheduling related questions, please contact the cardiac imaging nurse navigator should you have any questions/concerns: Cardiac Imaging Nurse Navigators Direct Office Dial: [phone]   For scheduling needs, including cancellations and rescheduling, please call [place], [phone].   Follow-Up: At [facility], you and your health needs are our priority.  As part of our continuing mission to provide you with exceptional heart care, our providers are all part of one team.  This team includes your primary Cardiologist (physician) and Advanced Practice Providers or APPs (Physician Assistants and Nurse Practitioners) who all work together to provide you with the care you need, when you need  it.  Your next appointment:   2 month(s)  Provider:   Eligha Bridegroom, NP    We recommend signing up for the patient portal called "MyChart".  Sign up information is provided on this After Visit Summary.  MyChart is used to connect with patients for Virtual Visits (Telemedicine).  Patients are  able to view lab/test results, encounter notes, upcoming appointments, etc.  Non-urgent messages can be sent to your provider as well.   To learn more about what you can do with MyChart, go to [email or web address].   Other Instructions       1st Floor: - Lobby - Registration  - Pharmacy  - Lab - Cafe  2nd Floor: - PV Lab - Diagnostic Testing (echo, CT, nuclear med)  3rd Floor: - Vacant  4th Floor: - TCTS (cardiothoracic surgery) - AFib Clinic - Structural Heart Clinic - Vascular Surgery  - Vascular Ultrasound  5th Floor: - HeartCare Cardiology (general and EP) - Clinical Pharmacy for coumadin, hypertension, lipid, weight-loss medications, and med management appointments    Valet parking services will be available as well.

## 2023-06-10 ENCOUNTER — Encounter (HOSPITAL_COMMUNITY): Payer: Self-pay

## 2023-06-12 ENCOUNTER — Ambulatory Visit (HOSPITAL_COMMUNITY)
Admission: RE | Admit: 2023-06-12 | Discharge: 2023-06-12 | Disposition: A | Discharge status: Home / Self Care | Source: Ambulatory Visit | Attending: Nurse Practitioner | Admitting: Nurse Practitioner

## 2023-06-12 DIAGNOSIS — R072 Precordial pain: Secondary | ICD-10-CM | POA: Diagnosis not present

## 2023-06-12 MED ORDER — NITROGLYCERIN 0.4 MG SL SUBL
SUBLINGUAL_TABLET | SUBLINGUAL | Status: AC
  Filled 2023-06-12: qty 2

## 2023-06-12 MED ORDER — NITROGLYCERIN 0.4 MG SL SUBL
0.8000 mg | SUBLINGUAL_TABLET | Freq: Once | SUBLINGUAL | Status: AC
  Administered 2023-06-12: 0.8 mg via SUBLINGUAL

## 2023-06-12 MED ORDER — IOHEXOL 350 MG/ML SOLN
95.0000 mL | Freq: Once | INTRAVENOUS | Status: AC | PRN
  Administered 2023-06-12: 95 mL via INTRAVENOUS

## 2023-06-13 ENCOUNTER — Encounter (HOSPITAL_BASED_OUTPATIENT_CLINIC_OR_DEPARTMENT_OTHER): Payer: Self-pay

## 2023-07-09 NOTE — Progress Notes (Signed)
 Cardiology Office Note:  .   Date:  07/14/2023  ID:  TYELER DINES, DOB 1955-05-07, MRN [id number] PCP: Senaida Dama, NP  [facility] Providers Cardiologist:  Jann Melody, MD Cardiology APP:  Gerldine Koch, NP    Patient Profile: .      PMH Remote history of substance abuse Hepatitis B Hepatitis C NASH Cirrhosis Aortic atherosclerosis Coronary artery disease Hyperlipidemia GERD Tobacco abuse  Referred to cardiology and seen by Dr. Annabelle Barrack 05/15/2021 for calcification of coronary arteries on CT. Last clinic visit was 11/22/21 at which time she was working on quitting smoking. Was having difficulty with coughing spells that make her feel like her heart is going to come out of her chest. Cholesterol panel revealed LDL 58, near goal of < 55. LFTs were normal. She was advised to return in 6 months for f/u.   Seen by me on 11/15/2022 at which time she described "pin pricking her left chest" most often occurring when sitting still. No associated symptoms with this discomfort. Admits to significant life stress recently with the loss of her brother, stepfather, and a friend all in less than a year's time due to cancer. Occasionally feels heart fluttering when she is lying on left side, about 3 times per week which resolves after a few seconds and she is able to sleep well. Wakes herself snoring at times, no reported apnea. Is scheduled to have sinus surgery next week. We discussed consideration of sleep test if snoring continues post sinus surgery.   Seen by me on 06/02/2023 and describing chest pain that feels like  'a screwdriver twisting' over the left breast. Onset daily for the past five days, lasting for about a minute or two and then subsides. It typically occurs when she is active, such as when potting plants or bending over, and is not associated with n/v, diaphoresis or shortness of breath. Pain does not occur at rest. Reports burning leg pain but no pain  with walking. She reports swelling in legs and feet, bruising, and discoloration. She stopped taking B12 and vitamin D  supplements because she thought they were causing weight loss. She is frustrated with the number of medications she is currently taking and expresses a desire to reduce them.  She unfortunately started back smoking after a 3-week hiatus post sinus surgery.  Previous use of patches and gum has not been helpful. Admits to life stress with caring for her mother who just got a pacemaker at age 71. She denies shortness of breath, orthopnea, PND, palpitations, presyncope, syncope. LDL cholesterol 66 on 03/19/23. ALT is mildly elevated at 69 on rosuvastatin  20 mg daily. EKG revealed possible previous anterior infarct, age undetermined, however no significant change in anterior leads in comparison to previous EKG.   Coronary CTA was ordered for ischemia evaluation and revealed CAC score of 134, 84th percentile, mild noncalcified plaque (25 to 49%) in the distal LCx, minimal plaque in LAD/RCA (less than 25%), non-obstructive CAD.         History of Present Illness: .    History of Present Illness NEREA BETTINGER "Carmelina Chinchilla" is a very pleasant 68 year old female who presents today for follow-up of coronary artery disease. She experiences shortness of breath upon exertion but denies chest pain, palpitations, orthopnea, PND, edema, presyncope and syncope. Unfortunately, she continues to smoke and reports no desire to quit due to how it helps her cope with stress. No associated symptoms such as pain, lightheadedness, or nausea with  exertion. We reviewed coronary CTA in detail. Her blood pressure remains stable without medication, and her LDL cholesterol has improved to 66. She continues to smoke and consumes a lot of sugar but is incorporating more fruits into her diet. She has attempted to quit smoking using patches and medication without success, citing stress and anxiety as barriers. She no longer  consumes alcohol due to cirrhosis. She admits to continued snoring post sinus surgery.    ROS: See HPI       Studies Reviewed: .         Risk Assessment/Calculations:         STOP-Bang Score:  4      Physical Exam:   VS:  BP 134/82   Pulse 73   Ht 5\' 6"  (1.676 m)   Wt 138 lb (62.6 kg)   LMP  (LMP Unknown)   SpO2 95%   BMI 22.27 kg/m    Wt Readings from Last 3 Encounters:  07/14/23 138 lb (62.6 kg)  06/02/23 142 lb 12.8 oz (64.8 kg)  03/19/23 141 lb 6 oz (64.1 kg)    GEN: Well nourished, well developed in no acute distress NECK: No JVD; No carotid bruits CARDIAC: RRR, no murmurs, rubs, gallops RESPIRATORY:  Clear to auscultation without rales, wheezing or rhonchi  ABDOMEN: Soft, non-tender, non-distended EXTREMITIES:  No edema; No deformity     ASSESSMENT AND PLAN: .    CAD without angina/Aortic atherosclerosis: Coronary CTA completed 06/12/2023 with mild noncalcified plaque (25 to 49%) in distal LCx, minimal plaque in LAD/RCA (< 25%). CAC score 134 (84th percentile). She reports shortness of breath that is not significantly worse with exertion. Previous report of "stabbing" chest pains that have resolved. She reports constant activity with caring for her mom. She denies chest pain, or other symptoms concerning for angina. No indication for further ischemia evaluation at this time. BP is well controlled. Encouraged continued focus on secondary prevention including heart healthy mostly plant based diet avoiding saturated fat, processed foods, simple carbohydrates, and sugar along with aiming for at least 150 minutes of moderate intensity exercise each week.  Continue rosuvastatin .   Hyperlipidemia LDL goal < 70: Lipid panel completed 03/19/2023 with total cholesterol 131, triglycerides 98, HDL 47, and LDL-C 66. She is tolerating rosuvastatin  without significant side effect.  ALT is mildly elevated but stable with baseline. She has history of cirrhosis. We will continue rosuvastatin   for now.   Snoring: Admits to snoring and possible episodes of apnea per her partner. Stop Bang score of 4. We will get home sleep study to evaluate for sleep apnea.  Tobacco use disorder:  Reports she is not ready to quit. Complete cessation advised.        Disposition: 1 year with Dr. Paulita Boss or me  Signed, Slater Duncan, NP-C

## 2023-07-14 ENCOUNTER — Ambulatory Visit (INDEPENDENT_AMBULATORY_CARE_PROVIDER_SITE_OTHER): Admitting: Nurse Practitioner

## 2023-07-14 ENCOUNTER — Encounter (HOSPITAL_BASED_OUTPATIENT_CLINIC_OR_DEPARTMENT_OTHER): Payer: Self-pay | Admitting: Nurse Practitioner

## 2023-07-14 VITALS — BP 134/82 | HR 73 | Ht 66.0 in | Wt 138.0 lb

## 2023-07-14 DIAGNOSIS — R0683 Snoring: Secondary | ICD-10-CM | POA: Diagnosis not present

## 2023-07-14 DIAGNOSIS — I2510 Atherosclerotic heart disease of native coronary artery without angina pectoris: Secondary | ICD-10-CM | POA: Diagnosis not present

## 2023-07-14 DIAGNOSIS — I700 Atherosclerosis of aorta: Secondary | ICD-10-CM | POA: Diagnosis not present

## 2023-07-14 DIAGNOSIS — Z720 Tobacco use: Secondary | ICD-10-CM

## 2023-07-14 DIAGNOSIS — E785 Hyperlipidemia, unspecified: Secondary | ICD-10-CM

## 2023-07-14 NOTE — Patient Instructions (Signed)
 Medication Instructions:   Your physician recommends that you continue on your current medications as directed. Please refer to the Current Medication list given to you today.   *If you need a refill on your cardiac medications before your next appointment, please call your pharmacy*  Lab Work:  None ordered.  If you have labs (blood work) drawn today and your tests are completely normal, you will receive your results only by: MyChart Message (if you have MyChart) OR A paper copy in the mail If you have any lab test that is abnormal or we need to change your treatment, we will call you to review the results.  Testing/Procedures:  Your physician has recommended that you have a sleep study. This test records several body functions during sleep, including: brain activity, eye movement, oxygen and carbon dioxide blood levels, heart rate and rhythm, breathing rate and rhythm, the flow of air through your mouth and nose, snoring, body muscle movements, and chest and belly movement.   Follow-Up: At [facility], you and your health needs are our priority.  As part of our continuing mission to provide you with exceptional heart care, our providers are all part of one team.  This team includes your primary Cardiologist (physician) and Advanced Practice Providers or APPs (Physician Assistants and Nurse Practitioners) who all work together to provide you with the care you need, when you need it.  Your next appointment:   1 year(s)  Provider:   Jann Melody, MD or Lawana Pray, NP, Lovette Rud, PA-C, Charles Connor, NP, Marcie Sever, PA-C, Dayna Dunn, PA-C, Madison Fountain, NP, Callie Goodrich, PA-C, Michele Lenze, PA-C, Hao Meng, PA-C, Marlana Silvan, NP, Marlyse Single, PA-C, Katlyn West, NP, or Leala Prince, PA-C          We recommend signing up for the patient portal called "MyChart".  Sign up information is provided on this After Visit Summary.  MyChart is used to connect with  patients for Virtual Visits (Telemedicine).  Patients are able to view lab/test results, encounter notes, upcoming appointments, etc.  Non-urgent messages can be sent to your provider as well.   To learn more about what you can do with MyChart, go to [email or web address].   Other Instructions  Your physician wants you to follow-up in: 6 months.  You will receive a reminder letter in the mail two months in advance. If you don't receive a letter, please call our office to schedule the follow-up appointment.

## 2023-07-17 ENCOUNTER — Telehealth: Payer: Self-pay

## 2023-07-17 ENCOUNTER — Ambulatory Visit (HOSPITAL_BASED_OUTPATIENT_CLINIC_OR_DEPARTMENT_OTHER): Admitting: Nurse Practitioner

## 2023-07-17 NOTE — Telephone Encounter (Signed)
**Note De-identified Akari Crysler Obfuscation** -----  **Note De-Identified Trenell Moxey Obfuscation** Message from Nurse Erick Hausen sent at 07/17/2023  9:47 AM EDT ----- Please prior auth itamar

## 2023-07-17 NOTE — Telephone Encounter (Signed)
**Note De-Identified Siobhan Zaro Obfuscation** Ordering provider: Slater Duncan, NP Associated diagnoses: Snoring-R06.83 and Somnolence-R40.0  WatchPAT PA obtained on 07/17/2023 by Yohann Curl, Isabella Mao, LPN. Authorization: Per [place] at [facility], a PA is not required for CPT Code: 78295 (Itamar-HST). Call Reference #: [id number]  Patient notified of PIN ([id number]) on 07/17/2023 Pocahontas Cohenour Notification Method: phone.  Phone note routed to covering staff for follow-up.

## 2023-07-21 ENCOUNTER — Encounter (INDEPENDENT_AMBULATORY_CARE_PROVIDER_SITE_OTHER): Payer: Self-pay | Admitting: Cardiology

## 2023-07-21 DIAGNOSIS — R0683 Snoring: Secondary | ICD-10-CM

## 2023-07-28 ENCOUNTER — Ambulatory Visit: Attending: Nurse Practitioner

## 2023-07-28 DIAGNOSIS — I700 Atherosclerosis of aorta: Secondary | ICD-10-CM

## 2023-07-28 DIAGNOSIS — R0683 Snoring: Secondary | ICD-10-CM

## 2023-07-28 DIAGNOSIS — E785 Hyperlipidemia, unspecified: Secondary | ICD-10-CM

## 2023-07-28 NOTE — Procedures (Signed)
     SLEEP STUDY REPORT Patient Information Study Date: 07/21/2023 Patient Name: Laura Sloan Patient ID: [id number] Birth Date: Sep 07, 1955 Age: 68 Gender: Female BMI: 22.3 (W=139 lb, H=5' 6'') Referring Physician: Slater Duncan, NP  TEST DESCRIPTION: Home sleep apnea testing was completed using the WatchPat, a Type 1 device, utilizing peripheral arterial tonometry (PAT), chest movement, actigraphy, pulse oximetry, pulse rate, body position and snore. AHI was calculated with apnea and hypopnea using valid sleep time as the denominator. RDI includes apneas, hypopneas, and RERAs. The data acquired and the scoring of sleep and all associated events were performed in accordance with the recommended standards and specifications as outlined in the AASM Manual for the Scoring of Sleep and Associated Events 2.2.0 (2015).  FINDINGS: 1. No evidence of Obstructive Sleep Apnea with AHI 0.5/hr. 2. No Central Sleep Apnea. 3. Oxygen desaturations as low as 87%. 4. Mild to moderate snoring was present. O2 sats were < 88% for 0 minutes. 5. Total sleep time was 7 hrs and 39 min. 6. 25.4% of total sleep time was spent in REM sleep. 7. Normal sleep onset latency at 19 min. 8. Shortened REM sleep onset latency at 83 min. 9. Total awakenings were 6.  DIAGNOSIS: Normal study with no significant sleep disordered breathing.  RECOMMENDATIONS: 1. Normal study with no significant sleep disordered breathing. 2. Healthy sleep recommendations include: adequate nightly sleep (normal 7-9 hrs/night), avoidance of caffeine after noon and alcohol near bedtime, and maintaining a sleep environment that is cool, dark and quiet. 3. Weight loss for overweight patients is recommended. 4. Snoring recommendations include: weight loss where appropriate, side sleeping, and avoidance of alcohol before bed. 5. Operation of motor vehicle or dangerous equipment must be avoided when feeling drowsy, excessively sleepy,  or mentally fatigued. 6. An ENT consultation which may be useful for specific causes of and possible treatment of bothersome snoring . 7. Weight loss may be of benefit in reducing the severity of snoring.   Signature: Gaylyn Keas, MD; [facility]; Diplomat, American Board of Sleep Medicine Electronically Signed: 07/28/2023 8:03:11 PM

## 2023-07-30 ENCOUNTER — Telehealth: Payer: Self-pay | Admitting: *Deleted

## 2023-07-30 NOTE — Telephone Encounter (Signed)
-----   Message from Gaylyn Keas sent at 07/28/2023  8:04 PM EDT ----- Please let patient know that sleep study showed no significant sleep apnea.

## 2023-07-30 NOTE — Telephone Encounter (Signed)
 The patient has been notified of the result via her mychart and telephone. Please call our office back at [phone] if you have questions concerning your sleep study result.

## 2023-08-18 ENCOUNTER — Ambulatory Visit (HOSPITAL_BASED_OUTPATIENT_CLINIC_OR_DEPARTMENT_OTHER): Admitting: Nurse Practitioner

## 2023-08-22 ENCOUNTER — Inpatient Hospital Stay: Payer: Medicaid Other | Attending: Hematology and Oncology

## 2023-08-22 ENCOUNTER — Other Ambulatory Visit: Payer: Self-pay | Admitting: Hematology and Oncology

## 2023-08-22 DIAGNOSIS — Z820 Family history of epilepsy and other diseases of the nervous system: Secondary | ICD-10-CM | POA: Insufficient documentation

## 2023-08-22 DIAGNOSIS — F419 Anxiety disorder, unspecified: Secondary | ICD-10-CM | POA: Diagnosis not present

## 2023-08-22 DIAGNOSIS — D751 Secondary polycythemia: Secondary | ICD-10-CM | POA: Insufficient documentation

## 2023-08-22 DIAGNOSIS — F17210 Nicotine dependence, cigarettes, uncomplicated: Secondary | ICD-10-CM | POA: Diagnosis not present

## 2023-08-22 DIAGNOSIS — Z8249 Family history of ischemic heart disease and other diseases of the circulatory system: Secondary | ICD-10-CM | POA: Insufficient documentation

## 2023-08-22 DIAGNOSIS — Z808 Family history of malignant neoplasm of other organs or systems: Secondary | ICD-10-CM | POA: Diagnosis not present

## 2023-08-22 DIAGNOSIS — B182 Chronic viral hepatitis C: Secondary | ICD-10-CM | POA: Diagnosis not present

## 2023-08-22 DIAGNOSIS — Z83719 Family history of colon polyps, unspecified: Secondary | ICD-10-CM | POA: Diagnosis not present

## 2023-08-22 DIAGNOSIS — D696 Thrombocytopenia, unspecified: Secondary | ICD-10-CM | POA: Diagnosis not present

## 2023-08-22 DIAGNOSIS — Z9089 Acquired absence of other organs: Secondary | ICD-10-CM | POA: Insufficient documentation

## 2023-08-22 DIAGNOSIS — D472 Monoclonal gammopathy: Secondary | ICD-10-CM

## 2023-08-22 DIAGNOSIS — Z5986 Financial insecurity: Secondary | ICD-10-CM | POA: Insufficient documentation

## 2023-08-22 DIAGNOSIS — Z8051 Family history of malignant neoplasm of kidney: Secondary | ICD-10-CM | POA: Diagnosis not present

## 2023-08-22 DIAGNOSIS — R42 Dizziness and giddiness: Secondary | ICD-10-CM | POA: Diagnosis not present

## 2023-08-22 DIAGNOSIS — Z79899 Other long term (current) drug therapy: Secondary | ICD-10-CM | POA: Diagnosis not present

## 2023-08-22 LAB — CMP (CANCER CENTER ONLY)
ALT: 57 U/L — ABNORMAL HIGH (ref 0–44)
AST: 69 U/L — ABNORMAL HIGH (ref 15–41)
Albumin: 4.4 g/dL (ref 3.5–5.0)
Alkaline Phosphatase: 82 U/L (ref 38–126)
Anion gap: 6 (ref 5–15)
BUN: 15 mg/dL (ref 8–23)
CO2: 30 mmol/L (ref 22–32)
Calcium: 9.7 mg/dL (ref 8.9–10.3)
Chloride: 106 mmol/L (ref 98–111)
Creatinine: 0.7 mg/dL (ref 0.44–1.00)
GFR, Estimated: >60 mL/min (ref >60)
Glucose, Bld: 106 mg/dL — ABNORMAL HIGH (ref 70–99)
Potassium: 4.2 mmol/L (ref 3.5–5.1)
Sodium: 142 mmol/L (ref 135–145)
Total Bilirubin: 0.6 mg/dL (ref 0.0–1.2)
Total Protein: 8.1 g/dL (ref 6.5–8.1)

## 2023-08-22 LAB — CBC WITH DIFFERENTIAL (CANCER CENTER ONLY)
Abs Immature Granulocytes: 0.01 10*3/uL (ref 0.00–0.07)
Basophils Absolute: 0 10*3/uL (ref 0.0–0.1)
Basophils Relative: 0 %
Eosinophils Absolute: 0.1 10*3/uL (ref 0.0–0.5)
Eosinophils Relative: 2 %
HCT: 43.8 % (ref 36.0–46.0)
Hemoglobin: 14.7 g/dL (ref 12.0–15.0)
Immature Granulocytes: 0 %
Lymphocytes Relative: 35 %
Lymphs Abs: 1.6 10*3/uL (ref 0.7–4.0)
MCH: 29.3 pg (ref 26.0–34.0)
MCHC: 33.6 g/dL (ref 30.0–36.0)
MCV: 87.3 fL (ref 80.0–100.0)
Monocytes Absolute: 0.4 10*3/uL (ref 0.1–1.0)
Monocytes Relative: 9 %
Neutro Abs: 2.5 10*3/uL (ref 1.7–7.7)
Neutrophils Relative %: 54 %
Platelet Count: 74 10*3/uL — ABNORMAL LOW (ref 150–400)
RBC: 5.02 MIL/uL (ref 3.87–5.11)
RDW: 13.8 % (ref 11.5–15.5)
WBC Count: 4.5 10*3/uL (ref 4.0–10.5)
nRBC: 0 % (ref 0.0–0.2)

## 2023-08-22 LAB — LACTATE DEHYDROGENASE: LDH: 236 U/L — ABNORMAL HIGH (ref 98–192)

## 2023-08-25 LAB — KAPPA/LAMBDA LIGHT CHAINS
Kappa free light chain: 56.1 mg/L — ABNORMAL HIGH (ref 3.3–19.4)
Kappa, lambda light chain ratio: 2.38 — ABNORMAL HIGH (ref 0.26–1.65)
Lambda free light chains: 23.6 mg/L (ref 5.7–26.3)

## 2023-08-26 LAB — MULTIPLE MYELOMA PANEL, SERUM
Albumin SerPl Elph-Mcnc: 4 g/dL (ref 2.9–4.4)
Albumin/Glob SerPl: 1.2 (ref 0.7–1.7)
Alpha 1: 0.3 g/dL (ref 0.0–0.4)
Alpha2 Glob SerPl Elph-Mcnc: 0.6 g/dL (ref 0.4–1.0)
B-Globulin SerPl Elph-Mcnc: 1.1 g/dL (ref 0.7–1.3)
Gamma Glob SerPl Elph-Mcnc: 1.6 g/dL (ref 0.4–1.8)
Globulin, Total: 3.6 g/dL (ref 2.2–3.9)
IgA: 313 mg/dL (ref 87–352)
IgG (Immunoglobin G), Serum: 1744 mg/dL — ABNORMAL HIGH (ref 586–1602)
IgM (Immunoglobulin M), Srm: 100 mg/dL (ref 26–217)
Total Protein ELP: 7.6 g/dL (ref 6.0–8.5)

## 2023-08-29 ENCOUNTER — Inpatient Hospital Stay (HOSPITAL_BASED_OUTPATIENT_CLINIC_OR_DEPARTMENT_OTHER): Payer: Medicaid Other | Admitting: Hematology and Oncology

## 2023-08-29 VITALS — BP 142/81 | HR 65 | Temp 97.9°F | Resp 13 | Wt 138.7 lb

## 2023-08-29 DIAGNOSIS — D472 Monoclonal gammopathy: Secondary | ICD-10-CM | POA: Diagnosis not present
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
## 2023-09-01 ENCOUNTER — Telehealth: Payer: Self-pay | Admitting: *Deleted

## 2023-09-01 ENCOUNTER — Telehealth: Payer: Self-pay | Admitting: Hematology and Oncology

## 2023-09-01 DIAGNOSIS — K7460 Unspecified cirrhosis of liver: Secondary | ICD-10-CM

## 2023-09-01 NOTE — Telephone Encounter (Signed)
-----   Message from Nurse Naomie RAMAN sent at 03/20/2023  1:07 PM EST ----- Needs repeat abd u/s around 09/17/23 for hcc screen; dx cirrhosis; [facility] pt. See 03/20/23 imaging result note

## 2023-09-01 NOTE — Telephone Encounter (Signed)
 Patient has been scheduled for RUQ ultrasound on Monday, 09/29/23 at 9 am, 845 am arrival at [facility]. NPO after midnight.

## 2023-09-01 NOTE — Telephone Encounter (Signed)
 Rescheduled appointment per patients request via incoming call. Talked with the patient and she is aware of the changes made to her upcoming appointment.

## 2023-09-29 ENCOUNTER — Ambulatory Visit (HOSPITAL_COMMUNITY)
Admission: RE | Admit: 2023-09-29 | Discharge: 2023-09-29 | Disposition: A | Discharge status: Home / Self Care | Source: Ambulatory Visit | Attending: Gastroenterology | Admitting: Gastroenterology

## 2023-09-29 DIAGNOSIS — K7460 Unspecified cirrhosis of liver: Secondary | ICD-10-CM | POA: Insufficient documentation

## 2023-10-03 ENCOUNTER — Ambulatory Visit: Payer: Self-pay | Admitting: Nurse Practitioner

## 2023-10-06 ENCOUNTER — Other Ambulatory Visit: Payer: Self-pay

## 2023-10-06 DIAGNOSIS — B1920 Unspecified viral hepatitis C without hepatic coma: Secondary | ICD-10-CM

## 2023-10-06 DIAGNOSIS — K7460 Unspecified cirrhosis of liver: Secondary | ICD-10-CM

## 2023-10-08 ENCOUNTER — Other Ambulatory Visit (INDEPENDENT_AMBULATORY_CARE_PROVIDER_SITE_OTHER)

## 2023-10-08 DIAGNOSIS — K7460 Unspecified cirrhosis of liver: Secondary | ICD-10-CM

## 2023-10-08 DIAGNOSIS — K7469 Other cirrhosis of liver: Secondary | ICD-10-CM

## 2023-10-08 DIAGNOSIS — B1920 Unspecified viral hepatitis C without hepatic coma: Secondary | ICD-10-CM

## 2023-10-08 LAB — COMPREHENSIVE METABOLIC PANEL WITH GFR
ALT: 48 U/L — ABNORMAL HIGH (ref 0–35)
AST: 52 U/L — ABNORMAL HIGH (ref 0–37)
Albumin: 4.3 g/dL (ref 3.5–5.2)
Alkaline Phosphatase: 69 U/L (ref 39–117)
BUN: 13 mg/dL (ref 6–23)
CO2: 28 meq/L (ref 19–32)
Calcium: 9.5 mg/dL (ref 8.4–10.5)
Chloride: 103 meq/L (ref 96–112)
Creatinine, Ser: 0.79 mg/dL (ref 0.40–1.20)
GFR: 78.37 mL/min (ref >60.00)
Glucose, Bld: 102 mg/dL — ABNORMAL HIGH (ref 70–99)
Potassium: 4 meq/L (ref 3.5–5.1)
Sodium: 139 meq/L (ref 135–145)
Total Bilirubin: 0.6 mg/dL (ref 0.2–1.2)
Total Protein: 8 g/dL (ref 6.0–8.3)

## 2023-10-08 LAB — CBC WITH DIFFERENTIAL/PLATELET
Basophils Absolute: 0 K/uL (ref 0.0–0.1)
Basophils Relative: 0.9 % (ref 0.0–3.0)
Eosinophils Absolute: 0.1 K/uL (ref 0.0–0.7)
Eosinophils Relative: 1.8 % (ref 0.0–5.0)
HCT: 43.9 % (ref 36.0–46.0)
Hemoglobin: 14.5 g/dL (ref 12.0–15.0)
Lymphocytes Relative: 40.7 % (ref 12.0–46.0)
Lymphs Abs: 1.9 K/uL (ref 0.7–4.0)
MCHC: 33 g/dL (ref 30.0–36.0)
MCV: 87.9 fl (ref 78.0–100.0)
Monocytes Absolute: 0.4 K/uL (ref 0.1–1.0)
Monocytes Relative: 9 % (ref 3.0–12.0)
Neutro Abs: 2.2 K/uL (ref 1.4–7.7)
Neutrophils Relative %: 47.6 % (ref 43.0–77.0)
Platelets: 91 K/uL — ABNORMAL LOW (ref 150.0–400.0)
RBC: 5 Mil/uL (ref 3.87–5.11)
RDW: 14.4 % (ref 11.5–15.5)
WBC: 4.6 K/uL (ref 4.0–10.5)

## 2023-10-08 LAB — PROTIME-INR
INR: 1.2 ratio — ABNORMAL HIGH (ref 0.8–1.0)
Prothrombin Time: 12.8 s (ref 9.6–13.1)

## 2023-10-10 LAB — AFP TUMOR MARKER: AFP-Tumor Marker: 5.1 ng/mL

## 2023-10-10 LAB — ANTI-SMOOTH MUSCLE ANTIBODY, IGG: Actin (Smooth Muscle) Antibody (IGG): <20 U (ref <20)

## 2023-10-10 LAB — HEPATITIS C RNA QUANTITATIVE
HCV Quantitative Log: <1.18 {Log_IU}/mL
HCV RNA, PCR, QN: <15 [IU]/mL

## 2023-10-14 ENCOUNTER — Ambulatory Visit (HOSPITAL_COMMUNITY)
Admission: RE | Admit: 2023-10-14 | Discharge: 2023-10-14 | Disposition: A | Discharge status: Home / Self Care | Source: Ambulatory Visit | Attending: Gastroenterology | Admitting: Gastroenterology

## 2023-10-14 ENCOUNTER — Other Ambulatory Visit: Payer: Self-pay | Admitting: Gastroenterology

## 2023-10-14 DIAGNOSIS — K7460 Unspecified cirrhosis of liver: Secondary | ICD-10-CM | POA: Insufficient documentation

## 2023-10-14 DIAGNOSIS — B1920 Unspecified viral hepatitis C without hepatic coma: Secondary | ICD-10-CM | POA: Insufficient documentation

## 2023-10-14 DIAGNOSIS — I700 Atherosclerosis of aorta: Secondary | ICD-10-CM | POA: Diagnosis not present

## 2023-10-14 DIAGNOSIS — K7469 Other cirrhosis of liver: Secondary | ICD-10-CM | POA: Diagnosis not present

## 2023-10-14 DIAGNOSIS — R109 Unspecified abdominal pain: Secondary | ICD-10-CM | POA: Diagnosis not present

## 2023-10-14 DIAGNOSIS — B182 Chronic viral hepatitis C: Secondary | ICD-10-CM | POA: Diagnosis not present

## 2023-10-14 MED ORDER — GADOBUTROL 1 MMOL/ML IV SOLN
6.0000 mL | Freq: Once | INTRAVENOUS | Status: AC | PRN
  Administered 2023-10-14 (×2): 6 mL via INTRAVENOUS

## 2023-10-14 MED ORDER — GADOBUTROL 1 MMOL/ML IV SOLN: 6.0000 mL | Freq: Once | INTRAVENOUS | Status: DC | PRN

## 2023-10-22 ENCOUNTER — Encounter: Payer: Self-pay | Admitting: Gastroenterology

## 2023-10-22 ENCOUNTER — Ambulatory Visit (INDEPENDENT_AMBULATORY_CARE_PROVIDER_SITE_OTHER): Admitting: Gastroenterology

## 2023-10-22 VITALS — BP 100/60 | HR 69 | Ht 66.0 in | Wt 137.0 lb

## 2023-10-22 DIAGNOSIS — K7460 Unspecified cirrhosis of liver: Secondary | ICD-10-CM

## 2023-10-22 DIAGNOSIS — K7469 Other cirrhosis of liver: Secondary | ICD-10-CM

## 2023-10-22 DIAGNOSIS — D696 Thrombocytopenia, unspecified: Secondary | ICD-10-CM | POA: Diagnosis not present

## 2023-10-22 DIAGNOSIS — K219 Gastro-esophageal reflux disease without esophagitis: Secondary | ICD-10-CM | POA: Diagnosis not present

## 2023-10-22 DIAGNOSIS — B182 Chronic viral hepatitis C: Secondary | ICD-10-CM

## 2023-10-22 DIAGNOSIS — K449 Diaphragmatic hernia without obstruction or gangrene: Secondary | ICD-10-CM | POA: Diagnosis not present

## 2023-10-22 MED ORDER — FAMOTIDINE 20 MG PO TABS: 20.0000 mg | ORAL_TABLET | Freq: Every day | ORAL | 3 refills | Status: DC

## 2023-10-22 NOTE — Patient Instructions (Addendum)
 VISIT SUMMARY:  Today, we discussed your compensated cirrhosis, GERD symptoms, and other health concerns. We reviewed your recent MRI results and made adjustments to your treatment plan to help manage your symptoms and improve your overall health.  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 Your condition is stable with no serious complications, but your platelet count is slightly low. -Monitor liver function and platelet count every six months. -Schedule an upper endoscopy to check for varices. -Avoid alcohol and herbal supplements. -Avoid medications that affect the liver.  GASTROESOPHAGEAL REFLUX DISEASE (GERD) WITH HIATAL HERNIA: You are experiencing GERD symptoms, including a congested cough and regurgitation, which are worsened by lying flat and bending over. A small hiatal hernia is contributing to these symptoms. -Add famotidine  (Pepcid ) at bedtime. -Continue taking pantoprazole  in the morning. -Avoid bending over after meals and elevate the head of your bed. -Schedule an upper endoscopy to assess for changes in the hiatal hernia and reflux.  CHRONIC HEPATITIS C INFECTION: Your hepatitis C infection is not causing liver decompensation as you have successfully completed treatment. We will focus on monitoring your liver function and preventing further liver damage. -Continue monitoring liver function tests every six months.
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
## 2023-10-27 DIAGNOSIS — J439 Emphysema, unspecified: Secondary | ICD-10-CM | POA: Diagnosis not present

## 2023-10-27 DIAGNOSIS — Z008 Encounter for other general examination: Secondary | ICD-10-CM | POA: Diagnosis not present

## 2023-10-27 DIAGNOSIS — F3341 Major depressive disorder, recurrent, in partial remission: Secondary | ICD-10-CM | POA: Diagnosis not present

## 2023-10-27 DIAGNOSIS — E785 Hyperlipidemia, unspecified: Secondary | ICD-10-CM | POA: Diagnosis not present

## 2023-10-27 DIAGNOSIS — F17210 Nicotine dependence, cigarettes, uncomplicated: Secondary | ICD-10-CM | POA: Diagnosis not present

## 2023-10-27 DIAGNOSIS — K219 Gastro-esophageal reflux disease without esophagitis: Secondary | ICD-10-CM | POA: Diagnosis not present

## 2023-10-30 DIAGNOSIS — Z1231 Encounter for screening mammogram for malignant neoplasm of breast: Secondary | ICD-10-CM | POA: Diagnosis not present

## 2023-11-17 ENCOUNTER — Other Ambulatory Visit: Payer: Self-pay | Admitting: Nurse Practitioner

## 2023-12-09 ENCOUNTER — Ambulatory Visit (AMBULATORY_SURGERY_CENTER): Admitting: Gastroenterology

## 2023-12-09 ENCOUNTER — Encounter: Payer: Self-pay | Admitting: Gastroenterology

## 2023-12-09 VITALS — BP 129/66 | HR 57 | Temp 98.2°F | Resp 12 | Ht 66.0 in | Wt 139.0 lb

## 2023-12-09 DIAGNOSIS — K449 Diaphragmatic hernia without obstruction or gangrene: Secondary | ICD-10-CM

## 2023-12-09 DIAGNOSIS — K2100 Gastro-esophageal reflux disease with esophagitis, without bleeding: Secondary | ICD-10-CM

## 2023-12-09 DIAGNOSIS — K766 Portal hypertension: Secondary | ICD-10-CM

## 2023-12-09 DIAGNOSIS — K7460 Unspecified cirrhosis of liver: Secondary | ICD-10-CM | POA: Diagnosis not present

## 2023-12-09 DIAGNOSIS — K3189 Other diseases of stomach and duodenum: Secondary | ICD-10-CM

## 2023-12-09 DIAGNOSIS — Z13810 Encounter for screening for upper gastrointestinal disorder: Secondary | ICD-10-CM

## 2023-12-09 DIAGNOSIS — F419 Anxiety disorder, unspecified: Secondary | ICD-10-CM | POA: Diagnosis not present

## 2023-12-09 DIAGNOSIS — K219 Gastro-esophageal reflux disease without esophagitis: Secondary | ICD-10-CM

## 2023-12-09 MED ORDER — SODIUM CHLORIDE 0.9 % IV SOLN: 500.0000 mL | Freq: Once | INTRAVENOUS | Status: DC
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
## 2023-12-09 NOTE — Patient Instructions (Signed)
 Discharge instructions given. Handouts on Gastritis  Resume previous medications. Recall in for office visit no schedule out at present. YOU HAD AN ENDOSCOPIC PROCEDURE TODAY AT THE [facility]:   Refer to the procedure report that was given to you for any specific questions about what was found during the examination.  If the procedure report does not answer your questions, please call your gastroenterologist to clarify.  If you requested that your care partner not be given the details of your procedure findings, then the procedure report has been included in a sealed envelope for you to review at your convenience later.  YOU SHOULD EXPECT: Some feelings of bloating in the abdomen. Passage of more gas than usual.  Walking can help get rid of the air that was put into your GI tract during the procedure and reduce the bloating. If you had a lower endoscopy (such as a colonoscopy or flexible sigmoidoscopy) you may notice spotting of blood in your stool or on the toilet paper. If you underwent a bowel prep for your procedure, you may not have a normal bowel movement for a few days.  Please Note:  You might notice some irritation and congestion in your nose or some drainage.  This is from the oxygen used during your procedure.  There is no need for concern and it should clear up in a day or so.  SYMPTOMS TO REPORT IMMEDIATELY:   Following upper endoscopy (EGD)  Vomiting of blood or coffee ground material  New chest pain or pain under the shoulder blades  Painful or persistently difficult swallowing  New shortness of breath  Fever of 100F or higher  Black, tarry-looking stools  For urgent or emergent issues, a gastroenterologist can be reached at any hour by calling [phone]. Do not use MyChart messaging for urgent concerns.    DIET:  We do recommend a small meal at first, but then you may proceed to your regular diet.  Drink plenty of fluids but you should avoid alcoholic  beverages for 24 hours.  ACTIVITY:  You should plan to take it easy for the rest of today and you should NOT DRIVE or use heavy machinery until tomorrow (because of the sedation medicines used during the test).    FOLLOW UP: Our staff will call the number listed on your records the next business day following your procedure.  We will call around 7:15- 8:00 am to check on you and address any questions or concerns that you may have regarding the information given to you following your procedure. If we do not reach you, we will leave a message.     If any biopsies were taken you will be contacted by phone or by letter within the next 1-3 weeks.  Please call us  at [phone] if you have not heard about the biopsies in 3 weeks.    SIGNATURES/CONFIDENTIALITY: You and/or your care partner have signed paperwork which will be entered into your electronic medical record.  These signatures attest to the fact that that the information above on your After Visit Summary has been reviewed and is understood.  Full responsibility of the confidentiality of this discharge information lies with you and/or your care-partner.

## 2023-12-09 NOTE — Progress Notes (Unsigned)
 Pt's states no medical or surgical changes since previsit or office visit.

## 2023-12-09 NOTE — Op Note (Signed)
 [facility] Patient Name: Laura Sloan Procedure Date: 12/09/2023 8:38 AM MRN: [id number] Endoscopist: Laura Sloan , MD, [id number] Age: 68 Referring MD:  Date of Birth: 1955-11-15 Gender: Female Account #: [id number] Procedure:                Upper GI endoscopy Indications:              To evaluate esophageal varices in patient with                            cirrhosis and portal hypertension, Esophageal                            reflux symptoms that persist despite appropriate                            therapy Medicines:                Monitored Anesthesia Care Procedure:                Pre-Anesthesia Assessment:                           - Prior to the procedure, a History and Physical                            was performed, and patient medications and                            allergies were reviewed. The patient's tolerance of                            previous anesthesia was also reviewed. The risks                            and benefits of the procedure and the sedation                            options and risks were discussed with the patient.                            All questions were answered, and informed consent                            was obtained. Prior Anticoagulants: The patient has                            taken no anticoagulant or antiplatelet agents. ASA                            Grade Assessment: II - A patient with mild systemic                            disease. After reviewing the risks and benefits,  the patient was deemed in satisfactory condition to                            undergo the procedure.                           After obtaining informed consent, the endoscope was                            passed under direct vision. Throughout the                            procedure, the patient's blood pressure, pulse, and                            oxygen saturations were monitored continuously.  The                            GIF [id number] #[id number] was introduced through the                            mouth, and advanced to the second part of duodenum.                            The upper GI endoscopy was accomplished without                            difficulty. The patient tolerated the procedure                            well. Scope In: Scope Out: Findings:                 LA Grade A (one or more mucosal breaks less than 5                            mm, not extending between tops of 2 mucosal folds)                            esophagitis with no bleeding was found 34 to 36 cm                            from the incisors.                           A small hiatal hernia was present.                           Mild portal hypertensive gastropathy was found in                            the entire examined stomach.                           The cardia and gastric fundus were  normal on                            retroflexion.                           The examined duodenum was normal. Complications:            No immediate complications. Estimated Blood Loss:     Estimated blood loss was minimal. Impression:               - LA Grade A reflux esophagitis with no bleeding.                           - Small hiatal hernia.                           - Portal hypertensive gastropathy.                           - Normal examined duodenum.                           - No specimens collected. Recommendation:           - Resume previous diet.                           - Continue present medications.                           - Follow up in GI office for cirrhosis in 3 months                            with APP or Dr Laura Petties V. [name], MD 12/09/2023 9:01:35 AM This report has been signed electronically.

## 2023-12-09 NOTE — Progress Notes (Unsigned)
 Report to PACU, RN, vss, BBS= Clear.

## 2023-12-10 ENCOUNTER — Telehealth: Payer: Self-pay

## 2023-12-10 NOTE — Telephone Encounter (Signed)
  Follow up Call-     12/09/2023    7:37 AM  Call back number  Post procedure Call Back phone  # [phone]  Permission to leave phone message Yes     Patient questions:  Do you have a fever, pain , or abdominal swelling? No. Pain Score  0 *  Have you tolerated food without any problems? Yes.    Have you been able to return to your normal activities? Yes.    Do you have any questions about your discharge instructions: Diet   No. Medications  No. Follow up visit  No.  Do you have questions or concerns about your Care? No.  Actions: * If pain score is 4 or above: No action needed, pain <4.

## 2023-12-31 ENCOUNTER — Other Ambulatory Visit: Payer: Self-pay | Admitting: Medical Genetics

## 2023-12-31 DIAGNOSIS — Z006 Encounter for examination for normal comparison and control in clinical research program: Secondary | ICD-10-CM

## 2024-01-17 ENCOUNTER — Other Ambulatory Visit: Payer: Self-pay | Admitting: Gastroenterology

## 2024-02-29 ENCOUNTER — Other Ambulatory Visit: Payer: Self-pay | Admitting: Family

## 2024-02-29 DIAGNOSIS — F419 Anxiety disorder, unspecified: Secondary | ICD-10-CM

## 2024-03-01 NOTE — Telephone Encounter (Signed)
 Schedule appointment. Last office visit 02/12/2023. During the interim report to the Emergency Department/Urgent Care/call 911 for immediate medical evaluation.

## 2024-03-02 NOTE — Telephone Encounter (Signed)
 Pt has appt scheduled on 01/23

## 2024-03-11 ENCOUNTER — Encounter: Payer: Self-pay | Admitting: Family

## 2024-03-11 ENCOUNTER — Ambulatory Visit (INDEPENDENT_AMBULATORY_CARE_PROVIDER_SITE_OTHER): Admitting: Family

## 2024-03-11 VITALS — BP 139/87 | HR 67 | Temp 98.7°F | Resp 16 | Ht 66.0 in | Wt 141.2 lb

## 2024-03-11 DIAGNOSIS — F32A Depression, unspecified: Secondary | ICD-10-CM | POA: Diagnosis not present

## 2024-03-11 DIAGNOSIS — Z1329 Encounter for screening for other suspected endocrine disorder: Secondary | ICD-10-CM

## 2024-03-11 DIAGNOSIS — R519 Headache, unspecified: Secondary | ICD-10-CM

## 2024-03-11 DIAGNOSIS — F419 Anxiety disorder, unspecified: Secondary | ICD-10-CM | POA: Diagnosis not present

## 2024-03-11 MED ORDER — FLUOXETINE HCL 20 MG PO CAPS: 20.0000 mg | ORAL_CAPSULE | Freq: Every day | ORAL | 0 refills | Status: AC

## 2024-03-11 MED ORDER — SUMATRIPTAN SUCCINATE 25 MG PO TABS: ORAL_TABLET | ORAL | 1 refills | Status: AC
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
## 2024-03-11 NOTE — Progress Notes (Signed)
 Patient has a headache in the back of neck for 3 months, medication refill needs something stronger, patient wants her thyroid check, patient scored a 17 on GAD-7

## 2024-03-12 ENCOUNTER — Ambulatory Visit: Payer: Self-pay | Admitting: Family

## 2024-03-12 LAB — TSH: TSH: 1.02 u[IU]/mL (ref 0.450–4.500)

## 2024-03-16 ENCOUNTER — Telehealth: Payer: Self-pay | Admitting: Adult Health

## 2024-03-16 ENCOUNTER — Telehealth: Payer: Self-pay | Admitting: Family

## 2024-03-16 ENCOUNTER — Other Ambulatory Visit: Payer: Self-pay | Admitting: Family

## 2024-03-16 DIAGNOSIS — R519 Headache, unspecified: Secondary | ICD-10-CM

## 2024-03-16 NOTE — Telephone Encounter (Signed)
 Please advise

## 2024-03-16 NOTE — Telephone Encounter (Signed)
 Copied from CRM #[id number]. Topic: Referral - Request for Referral >> Mar 16, 2024 10:07 AM Joesph NOVAK wrote: Did the patient discuss referral with their provider in the last year? Yes (If No - schedule appointment) (If Yes - send message)  Appointment offered? No  Type of order/referral and detailed reason for visit: Neurologist / headaches   Preference of office, provider, location: [facility]   If referral order, have you been seen by this specialty before? No (If Yes, this issue or another issue? When? Where?  Can we respond through MyChart? Yes

## 2024-03-16 NOTE — Telephone Encounter (Signed)
 Patient said started having pain at the back of neck 3 months ago. Right now pain level 9, taken Tylenol, does help, but cannot take a lot of it due to have cirrhosis of the liver.  Informed patient would need a referral for new symptom. Patient verbalized understand.

## 2024-03-16 NOTE — Telephone Encounter (Signed)
 Complete

## 2024-03-26 ENCOUNTER — Ambulatory Visit: Admitting: Family

## 2024-04-14 ENCOUNTER — Ambulatory Visit: Payer: Self-pay | Admitting: Family

## 2024-04-19 ENCOUNTER — Ambulatory Visit: Admitting: Neurology

## 2024-08-25 ENCOUNTER — Ambulatory Visit: Admitting: Hematology and Oncology

## 2024-08-25 ENCOUNTER — Inpatient Hospital Stay

## 2024-09-01 ENCOUNTER — Inpatient Hospital Stay: Admitting: Hematology and Oncology
